# Patient Record
Sex: Female | Born: 1960 | Race: Black or African American | Hispanic: No | Marital: Single | Smoking: Never smoker
Health system: Southern US, Community
[De-identification: ages and names within clinical notes are randomized; demographics above are authoritative.]

## PROBLEM LIST (undated history)

## (undated) DIAGNOSIS — M1990 Unspecified osteoarthritis, unspecified site: Secondary | ICD-10-CM

## (undated) DIAGNOSIS — H269 Unspecified cataract: Secondary | ICD-10-CM

## (undated) DIAGNOSIS — I10 Essential (primary) hypertension: Secondary | ICD-10-CM

## (undated) HISTORY — DX: Unspecified osteoarthritis, unspecified site: M19.90

## (undated) HISTORY — DX: Essential (primary) hypertension: I10

## (undated) HISTORY — DX: Unspecified cataract: H26.9

## (undated) HISTORY — PX: ABDOMINAL HYSTERECTOMY: SHX81

---

## 1999-12-23 ENCOUNTER — Encounter: Payer: Self-pay | Admitting: Unknown Physician Specialty

## 1999-12-23 ENCOUNTER — Encounter: Admission: RE | Admit: 1999-12-23 | Discharge: 1999-12-23 | Payer: Self-pay | Admitting: Family Medicine

## 2001-06-24 ENCOUNTER — Other Ambulatory Visit: Admission: RE | Admit: 2001-06-24 | Discharge: 2001-06-24 | Payer: Self-pay | Admitting: Obstetrics and Gynecology

## 2001-11-07 ENCOUNTER — Inpatient Hospital Stay (HOSPITAL_COMMUNITY): Admission: RE | Admit: 2001-11-07 | Discharge: 2001-11-10 | Payer: Self-pay | Admitting: Obstetrics and Gynecology

## 2001-11-07 ENCOUNTER — Encounter (INDEPENDENT_AMBULATORY_CARE_PROVIDER_SITE_OTHER): Payer: Self-pay | Admitting: Specialist

## 2001-11-21 ENCOUNTER — Encounter: Admission: RE | Admit: 2001-11-21 | Discharge: 2001-11-21 | Payer: Self-pay | Admitting: Obstetrics and Gynecology

## 2001-11-21 ENCOUNTER — Encounter: Payer: Self-pay | Admitting: Obstetrics and Gynecology

## 2005-01-08 ENCOUNTER — Ambulatory Visit (HOSPITAL_COMMUNITY): Admission: RE | Admit: 2005-01-08 | Discharge: 2005-01-08 | Payer: Self-pay | Admitting: Obstetrics and Gynecology

## 2006-01-11 ENCOUNTER — Ambulatory Visit (HOSPITAL_COMMUNITY): Admission: RE | Admit: 2006-01-11 | Discharge: 2006-01-11 | Payer: Self-pay | Admitting: Internal Medicine

## 2008-04-19 ENCOUNTER — Ambulatory Visit: Payer: Self-pay | Admitting: Obstetrics and Gynecology

## 2008-04-19 ENCOUNTER — Ambulatory Visit (HOSPITAL_COMMUNITY): Admission: RE | Admit: 2008-04-19 | Discharge: 2008-04-19 | Payer: Self-pay | Admitting: Internal Medicine

## 2009-10-21 ENCOUNTER — Ambulatory Visit (HOSPITAL_COMMUNITY): Admission: RE | Admit: 2009-10-21 | Discharge: 2009-10-21 | Payer: Self-pay | Admitting: Internal Medicine

## 2009-10-28 ENCOUNTER — Encounter: Admission: RE | Admit: 2009-10-28 | Discharge: 2009-10-28 | Payer: Self-pay | Admitting: Internal Medicine

## 2010-04-07 ENCOUNTER — Encounter: Admission: RE | Admit: 2010-04-07 | Discharge: 2010-04-07 | Payer: Self-pay | Admitting: Internal Medicine

## 2010-11-04 ENCOUNTER — Encounter
Admission: RE | Admit: 2010-11-04 | Discharge: 2010-11-04 | Payer: Self-pay | Source: Home / Self Care | Attending: Internal Medicine | Admitting: Internal Medicine

## 2011-03-17 NOTE — Group Therapy Note (Signed)
NAMEJOIE, HIPPS NO.:  [id number]   MEDICAL RECORD NO.:  [id number]          PATIENT TYPE:  WOC   LOCATION:  [facility]                   FACILITY:  [facility]   PHYSICIAN:  Argentina Donovan, MD        DATE OF BIRTH:  1961/08/19   DATE OF SERVICE:                                  CLINIC NOTE   The patient is a 50 year old African American female came in for annual  pelvic examination.   HISTORY:  She is a gravida 3, para 3-0-0-3, underwent abdominal  hysterectomy 6 years ago for fibroid tumors, still has her ovaries and  tubes, is in good health except for her weight at 205 pounds and she is  5 feet 6 inches tall.  She is on Benicar and hydrochlorothiazide for  hypertension and today her blood pressure is 131/84. Pulse was 75 and  temperature was 96.9.   PHYSICAL EXAMINATION:  BREASTS:  Her breasts were extremely large  pendulous and no dominant masses.  No lymphadenopathy and no nipple  discharge.  ABDOMEN:  Soft, flat, nontender.  No masses, no organomegaly.  External genitalia is normal.  BUS within normal limits.  Vagina is  clean and well-rugated, status hysterectomy.  Bimanual pelvic  examination of the ovaries could not be palpated because of habitus of  the patient.  RECTAL:  Revealed no masses.   IMPRESSION:  Normal gynecological examination status post hysterectomy.  The patient has been informed today that she does not need further Pap  smears.  She had a mammogram today and will continue to get those  biannually until she is 49.  She has no family history of breast cancer,  and wet prep was taken because of a leukorrhea.           ______________________________  Argentina Donovan, MD     PR/MEDQ  D:  04/19/2008  T:  04/19/2008  Job:  [id number]

## 2011-03-20 IMAGING — MG MM DIGITAL SCREENING
4 series · 4 of 4 positions shown · non-contrast
Comparison: none

DG SCREEN MAMMOGRAM BILATERAL
Bilateral CC and MLO view(s) were taken.

DIGITAL SCREENING MAMMOGRAM WITH CAD:
There are scattered fibroglandular densities.  A possible mass is noted in the left breast.  Spot 
compression views and possibly sonography are recommended for further evaluation.  In the right 
breast, no masses or malignant type calcifications are identified.  Compared with prior studies.
Images were processed with CAD.

[R CC]
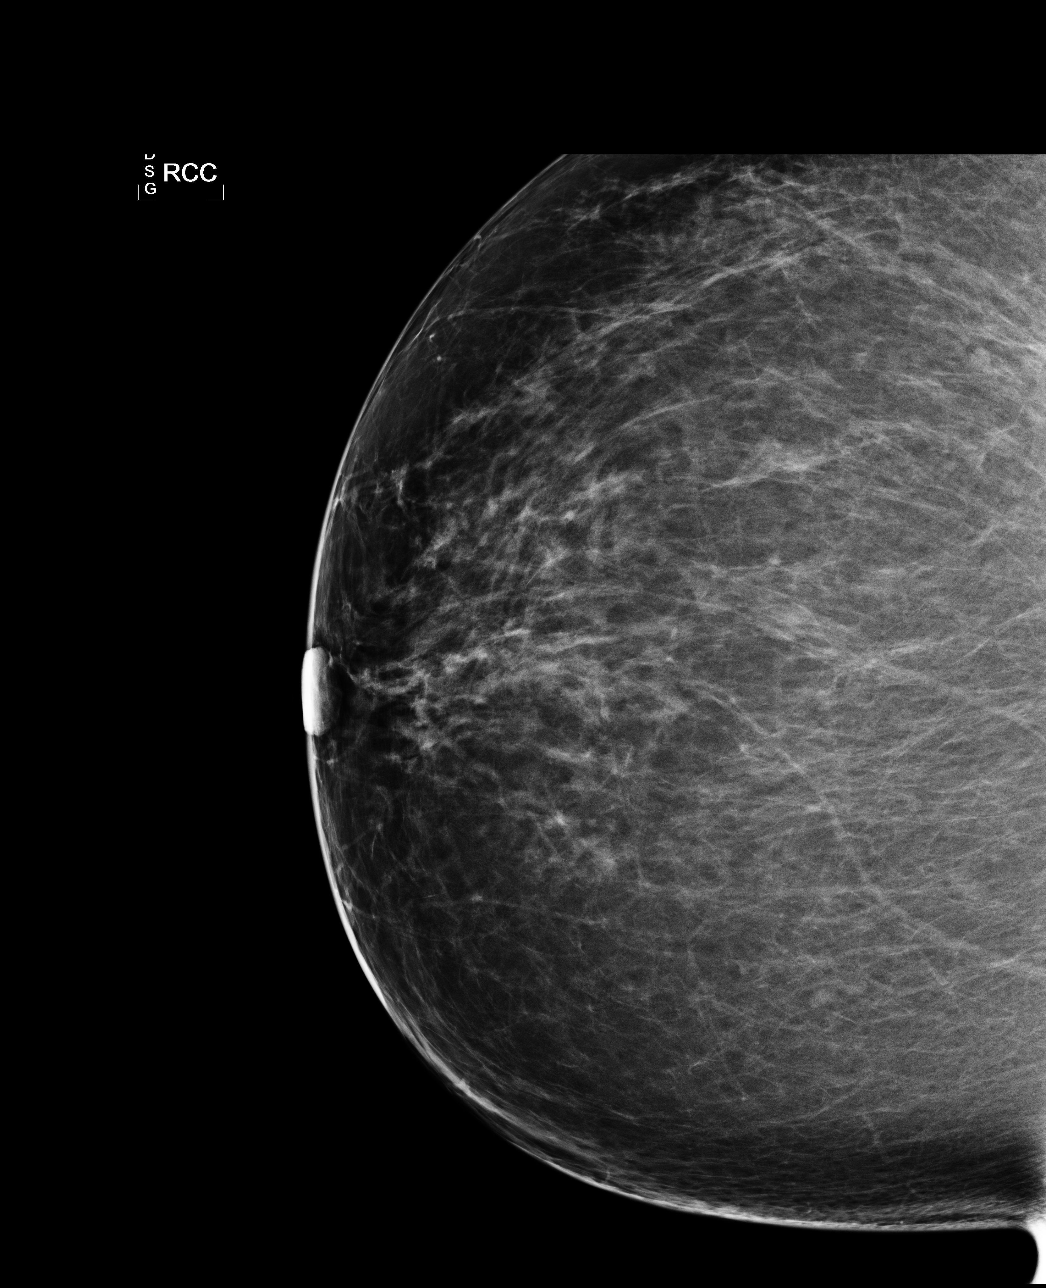

[R MLO]
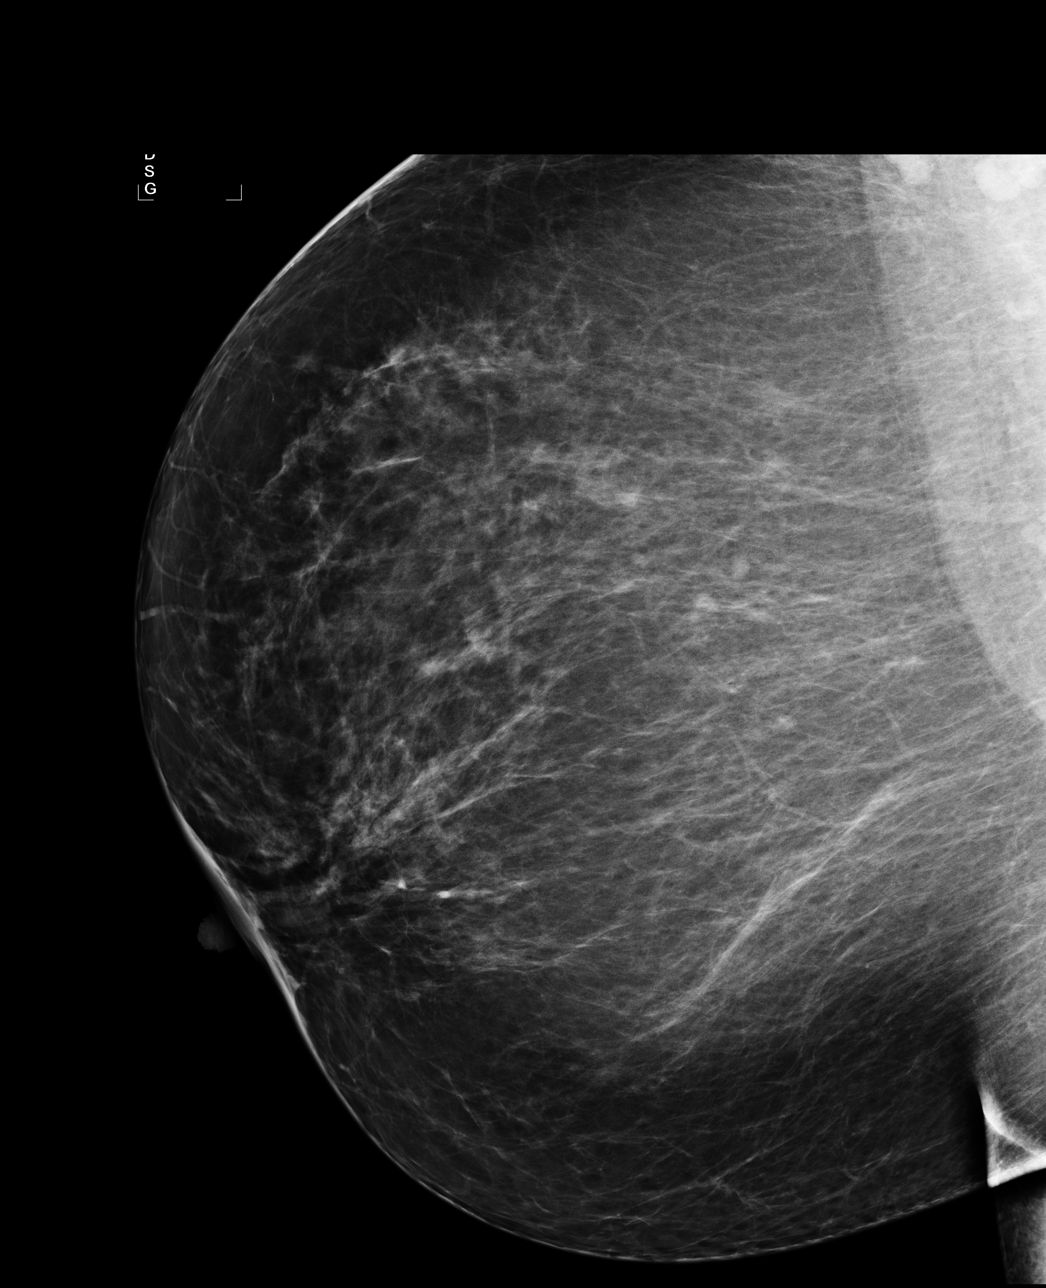

[L CC]
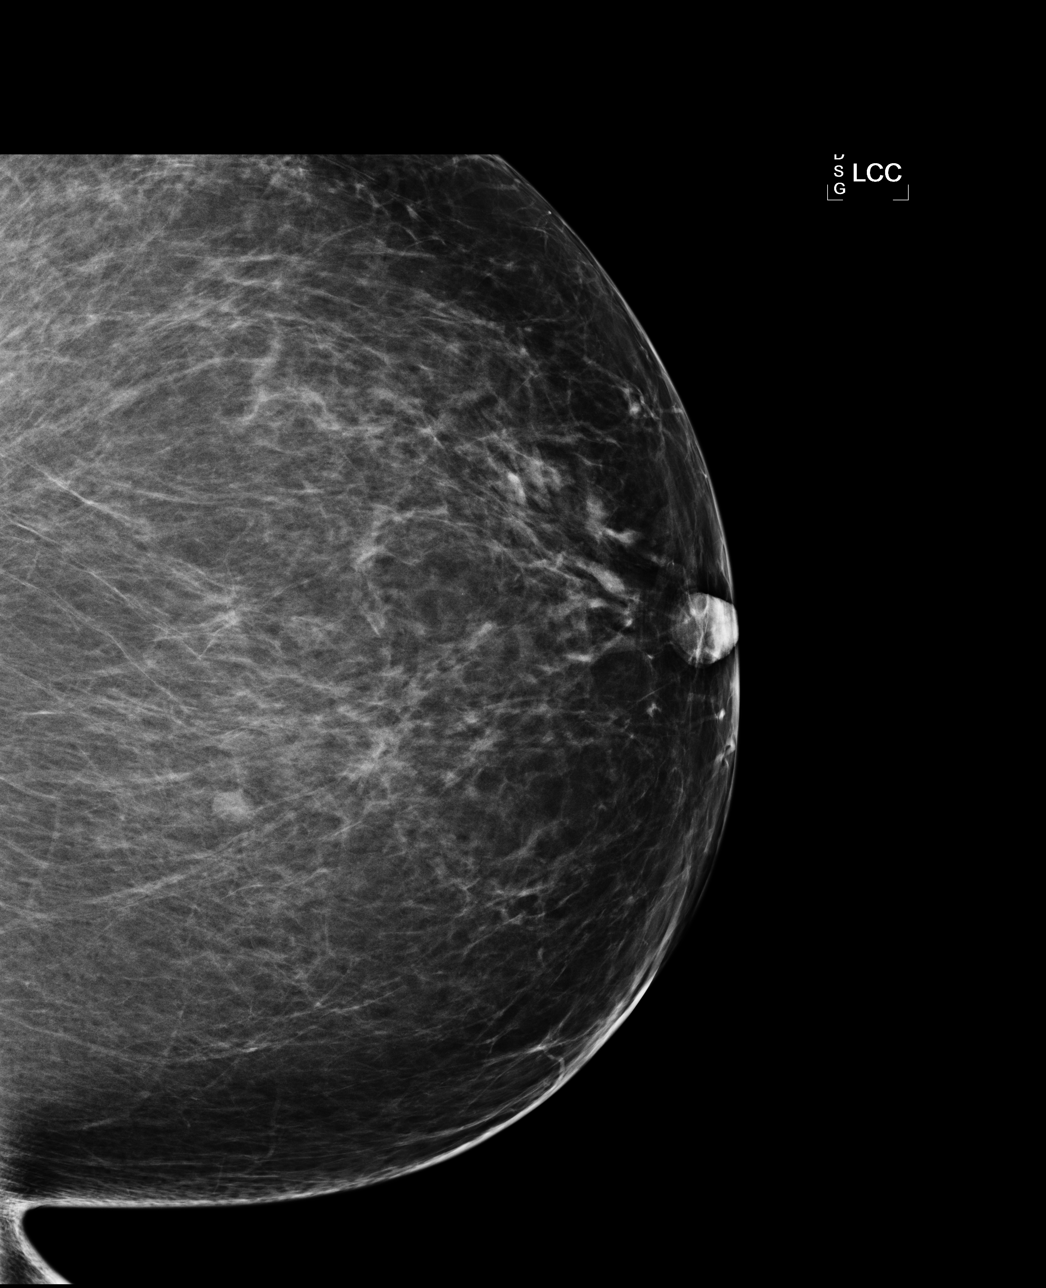

[L MLO]
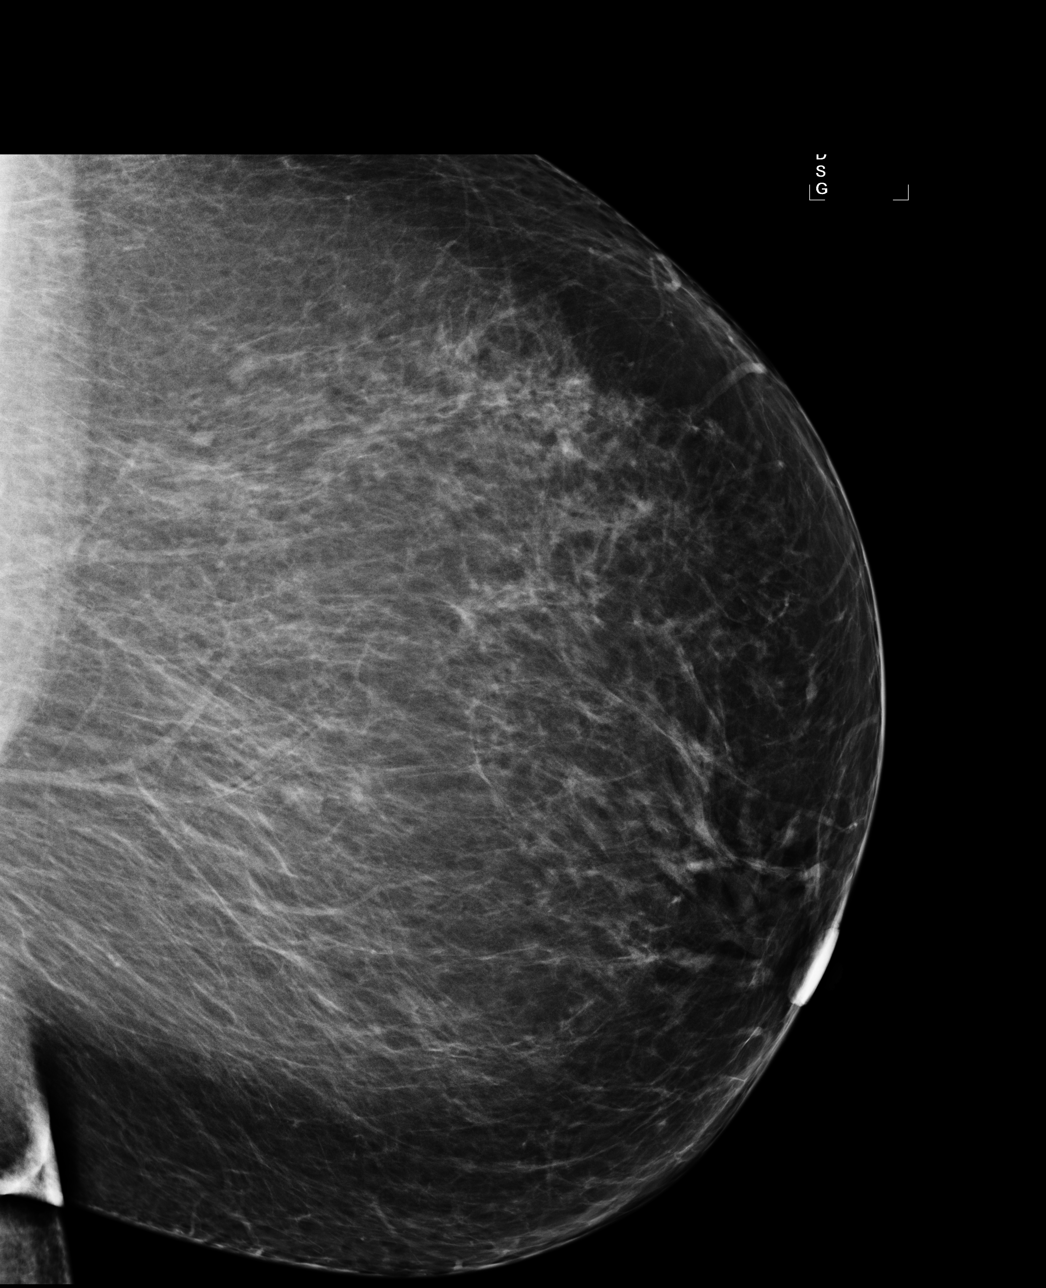

[4 of 4 positions shown; findings below may reference images not displayed]

IMPRESSION: Possible mass, left breast.  Additional evaluation is indicated.  The patient will be contacted for
additional studies and a supplementary report will follow.  No specific mammographic evidence of 
malignancy, right breast.

ASSESSMENT: Need additional imaging evaluation and/or prior mammograms for comparison - BI-RADS 0

Further imaging of the left breast.
,

## 2011-03-20 NOTE — H&P (Signed)
[facility]  Patient:    Angel Ramos, Angel Ramos Visit Number: [id number] MRN: [id number]          Service Type: Attending:  Sheronette A. Cherly Hensen, M.D. Dictated by:   Sheria Lang. Cherly Hensen, M.D. Adm. Date:  09/26/01                           History and Physical  DATE OF BIRTH:                1961/08/01  CHIEF COMPLAINT:              Heavy menses, uterine fibroids, desires removal of Norplant.  HISTORY OF PRESENT ILLNESS:   This is a 50 year old gravida 3 para 0, 0-3-0, single black female, last menstrual period September 13, 2001, now being admitted for total abdominal hysterectomy and removal of Norplant secondary to menorrhagia and uterine fibroids and no longer requiring Norplant.  The patient was previously followed by Dr. Okey Dupre in [place], [place]. She reports cycles heavy for the past five years and over the last three months she had started to soil her linens and clothing.  She wears two [employer] Always pads and changes them every two hours soaked.  The patient denies any clots or cramps.  Her cycles are 28 days apart, lasting about five days.  She has no intermenstrual or postcoital bleeding.  Her evaluation included an ultrasound done on June 24, 2001, at which time the uterus measured 18.2 x 7.7 x 9.3 cm with multiple fibroids.  The largest was 7.3 cm and located in the fundal area.  Her left ovary was not seen at the time, the right ovary appeared normal, and the endometrial stripe was not fully evaluated due to distortion by the fibroids.  Endometrial biopsy was performed on June 24, 2001 and showed secretory endometrium associated with focal granulomatous inflammation.  Fungal stains were negative subsequently.  Pap smear was normal on June 24, 2001.  The patient declines myomectomy.  She prefers definitive management of her menorrhagia.  ALLERGIES:                    No known drug allergies.  MEDICATIONS:                   1. Hydrochlorothiazide.                               2. Ativan p.r.n.  PAST MEDICAL HISTORY:         Recently diagnosed chronic hypertension.  PAST SURGICAL HISTORY:        D&C x 3.  PAST OBSTETRICAL HISTORY:     TAB x 3.  GYNECOLOGIC HISTORY:          Norplant.  SOCIAL HISTORY:               Works at [employer] at [employer].  She is single, involved.  FAMILY HISTORY:               Father died of diabetes, hypertension, and heart disease at age 50.  No breast, colon, or ovarian cancer in the family.  REVIEW OF SYSTEMS:            Occasional loss of urine with sneeze.  All other systems are negative with the exception of the abnormal vaginal bleeding documented in the History of Present  Illness.  PHYSICAL EXAMINATION:  GENERAL:                      Well-developed, well-nourished black female, anxious.  VITAL SIGNS:                  Blood pressure 152/100, pulse 82.  Weight 197 pounds.  SKIN:                         Notable for hypopigmented areas on vulva and breast.  HEENT:                        Sclerae anicteric.  Pale, pink conjunctivae. Oropharynx negative.  HEART:                        Regular rate and rhythm without murmur.  LUNGS:                        Clear to auscultation.  NODES:                        No palpable supraclavicular or axillary nodes noted.  No inguinal nodes noted.  NECK:                         Supple.  Thyroid not palpable.  ABDOMEN:                      Soft, with palpable mass arising to two fingerbreadths above the umbilicus, nontender.  Spleen or liver not palpable.  BREAST:                       Soft, nontender.  No palpable mass.  Upward nipples without discharge.  BACK:                         No CVA tenderness.  PELVIC:                       External genitalia, no palpable mass.  Some hyperpigmented areas as well.  Vagina, no discharge.  Cervix was closed and high in the vault.  Uterus about 20-22 week size,  irregular.  Adnexa unable to assess secondary to increased uterine size.  RECTAL:                       Deferred.  IMPRESSION:                   1. Symptomatic uterine fibroids.                               2. Chronic hypertension.                               3. Vitiligo.                               4. Norplant.  PLAN:                         1. Admission.  2. Exploratory laparotomy.                               3. Total abdominal hysterectomy.                               4. Removal of Norplant.                               5. Antibiotics prophylaxis.                               6. Antiembolic stockings.                               7. Resumption of antihypertensive medicines                                  postoperatively.                               8. Risks of surgery were reviewed with the                                  patient including, but not limited to,                                  infection, bleeding, internal scar tissue,                                  injury to surrounding organ structures such                                  as bowel, bladder, ureter; fistula formation.                                  Pros and cons of ovarian preservation were                                  discussed such as ovarian cyst formation,                                  ovarian cancer, and possible need for surgery                                  in the future as a result of these things.                                  Risks of blood transfusion including acute  reaction, hepatitis transmission 1:3000,                                  HIV 1:100,000 were discussed.  Loss of                                  fertility, childbearing also discussed.                                  Postoperative care and criteria for discharge                                  were reviewed.  All questions were  answered. Dictated by:   Sheria Lang. Cherly Hensen, M.D.  Attending:  Sheronette A. Cherly Hensen, M.D. DD:  09/26/01 TD:  09/26/01 Job: [id number]

## 2011-03-20 NOTE — Op Note (Signed)
[facility]  Patient:    Angel Ramos, PEAKE Visit Number: [id number] MRN: [id number]          Service Type: GYN Location: [phone] [id number] Attending Physician:  Maxie Better Dictated by:   Sheria Lang. Cherly Hensen, M.D. Proc. Date: 11/07/01 Admit Date:  11/07/2001                             Operative Report  PREOPERATIVE DIAGNOSES:       1. Menorrhagia.                               2. Uterine fibroids.                               3. Norplant contraception.  POSTOPERATIVE DIAGNOSES:      1. Menorrhagia.                               2. Uterine fibroids.                               3. Norplant contraception.  PROCEDURES:                   1. Examination under anesthesia.                               2. Exploratory laparotomy.                               3. Total abdominal hysterectomy.                               4. Removal of Norplant.  SURGEON:                      Sheronette A. Cherly Hensen, M.D.  ASSISTANT:                    Lenoard Aden, M.D.  ANESTHESIA:                   General.  INDICATIONS:                  The patient is a 50 year old gravida 3, para 0-0-3-0 female.  Her last menstrual period was November 05, 2001 to present. She has known uterine fibroids.  She is now being admitted for total abdominal hysterectomy secondary to menorrhagia.  The patient also has a Norplant in place, which she desires to have removed at this time.  Please see the dictated admission history and physical for all the details.  The risks and benefits of the procedure have been explained to the patient.  Consent was signed.  The patient was transferred to the operating room.  DESCRIPTION OF PROCEDURE:     Under adequate general anesthesia, the patient was placed in the supine position.  Examination under anesthesia revealed a uterus that was three fingerbreadths above the umbilicus.  A copious amount of malodorous menstrual blood was noted in  the vagina.  The  patient was sterilely prepped and draped in the usual fashion for hysterectomy.  An indwelling Foley catheter was placed.  The patient had received prophylactic antibiotics prior to transfer to the operating room.  Pneumoboots were utilized.  A marking pen was utilized to outline the incision on the skin.  A total of 8 cc of 0.5% Marcaine was injected along the imaginary incision line.  A Pfannenstiel skin incision was then made and carried down to the rectus fascia using sharp dissection.  The rectus fascia was then dissected in the midline and extended bilaterally.  The rectus fascia was then bluntly and with cautery dissected off the rectus muscle in superior and inferior fashion.  The rectus muscle was split in the midline.  The parietal peritoneum was entered bluntly and extended superiorly, inferiorly and transversely.  Exploration of the abdomen was notable for a markedly distorted uterus, about 18 weeks in size, with multiple fibroids.  The upper abdomen was palpable with a normal liver edge and normal kidneys.  The left round ligament was grasped with a Babcock, suture ligated with 0 Vicryl suture and severed using Bovie cautery.  The same procedure was performed on the contralateral right round ligament.  An attempt was then made to exteriorize the uterus.  However, it was noted to be limited due to the incision, at which time the skin incision was further extended. Reattempted exteriorization again was unsuccessful.  The fascia had been extended and subsequently opened in the midline for about 2 cm on the inferior aspect in order to facilitate more exposure.  The uterus was finally exteriorized and the tubes and ovaries were inspected.  The left tube and ovary appeared to be normal.  The right ovary had about a 3 cm cyst.  The right tube was otherwise normal.  The vesicouterine peritoneum was opened. The bladder was then sharply dissected off the lower uterine  segment and displaced inferiorly.  The posterior lip of the broad ligament on the left was then opened.  The utero-ovarian ligament was then doubly clamped, but, free tied with 0 Vicryl and suture ligated with 0 Vicryl.  Due to the exposure, the decision was made to proceed with isolating the uterine vessels on the left, doubly clamping them, cut, suture ligated with 0 Vicryl x 2.  On the right, the broad ligament was then opened and the right utero-ovarian ligaments were then doubly clamped, cut, free tied with 0 Vicryl and suture ligated with 0 Vicryl.  The uterine vessel was skeletonized, doubly clamped, cut and suture ligated with 0 Vicryl x 2.  Increased bleeding was noted at that time.  The vessel was isolated lateral to the pedicle and was suture ligated with 0 Vicryl suture.  A small bite of the cardinal ligament was then obtained on the right using straight Heaneys, cut and suture ligated with 0 Vicryl suture. The fundus of the uterus was truncated from its cervical attachment in order to facilitate the surgery.  The stump of the cervix was then grasped and the hysterectomy continued.  The bladder was further sharply dissected and displaced inferiorly.  The uterosacral ligaments were taut and distorted the anatomy posteriorly.  Nonetheless, the right uterosacral ligament was then clamped in two pieces and suture ligated with 0 Vicryl x 2.  This allowed for the cervix to be further mobilized.  The cardinal ligaments were then serially clamped, cut and suture ligated with 0 Vicryl until the cervicovaginal junction was reached on the right.  On the left,  again, the uterosacral ligament had been clamped.  However, due to its thickness, it was unable to be clamped in one sitting.  It was also subsequently cut and suture ligated with 0 Vicryl suture.  The cervix was then inspected.  The cardinal ligament on the left was then serially clamped, cut and suture ligated until  the cervicovaginal junction was reached.  The vagina was then opened posteriorly and, using curved scissors, the cervix was separated from its vaginal  attachment circumferentially.  The angles were then secured using a modified Richardson suture.  The vagina was reefed using 0 Vicryl running lock stitch. This was then closed vertically with interrupted 0 Vicryl figure-of-eight sutures.  Inspection of the pedicles was notable for some bleeding on the right, which was clamped and suture ligated with 3-0 Vicryl sutures.  The right ovarian cyst, which had not been noted on prior ultrasound, was cauterized and the fluid removed to prevent torsion of that ovary in the healing process.  The ovary otherwise appeared normal.  The ovarian pedicle was regrasped and free tied and suture ligated with 0 Vicryl and attached to the round ligament bilaterally.  The abdomen was then irrigated, suctioned of debris, reinspected and a small bleeder was noted on the left near the fallopian tube.  This was grasped with a Tresa Endo and a figure-of-eight suture was then used to secure hemostasis.  Reirrigation and suction showed subsequently good hemostasis, at which time the packings were removed, as was the Balfour retractor which had been placed when the body of the uterus had been severed from its cervical attachment.  Attempt at locating the appendix was unsuccessful and it was suggested that it was retrocecal.  The parietal peritoneum was then closed.  The undersurface of the rectus fascia was inspected.  Small bleeders were cauterized.  The vertical defect in the inferior aspect of the rectus fascia was closed with interrupted figure-of-eight sutures and then the rectus fascia was closed with 0 Vicryl x 2.  The subcutaneous area was irrigated.  Small bleeders were cauterized and the skin was approximated using Ethicon staples.  Attention was then turned to the left inner arm where the Norplant was placed.  The  area was prepped with Betadine.  Marcaine 0.5% was injected along the previous scar. Using a scalpel, and incision of about 1 cm was made and the Norplant insert x 5 was removed.  The skin was approximated using 4-0 Vicryl suture and Steri-Strips were subsequently placed.  SPECIMENS:                    1. Norplant inserts x 5.                               2. Uterus with cervix.  ESTIMATED BLOOD LOSS:         900 cc.  INTRAOPERATIVE FLUID:         1900 cc crystalloid.  URINE OUTPUT:                 225 cc clear yellow urine.  Of note, the ureters were inspected and palpated and noted to be normal in caliber bilaterally at the end of the case.  SPONGE AND INSTRUMENT COUNTS: Correct x 2.  COMPLICATIONS:                None.  DISPOSITION:  The patient tolerated the procedure well and was transferred to the recovery room in stable condition. Dictated by:   Sheria Lang. Cherly Hensen, M.D. Attending Physician:  Maxie Better DD:  11/07/01 TD:  11/08/01 Job: [id number]

## 2011-03-20 NOTE — Discharge Summary (Signed)
[facility]  Patient:    Angel Ramos, Angel Ramos Visit Number: [id number] MRN: [id number]          Service Type: GYN Location: [phone] [id number] Attending Physician:  Maxie Better Dictated by:   Sheria Lang. Cherly Hensen, M.D. Admit Date:  11/07/2001 Discharge Date: 11/10/2001                             Discharge Summary  ADMISSION DIAGNOSES:          1. Symptomatic uterine fibroids.                               2. Chronic hypertension.                               3. Vitiligo.                               4. Norplant contraception.  DISCHARGE DIAGNOSES:          1. Uterine fibroids.                               2. Chronic hypertension.                               3. Vitiligo.                               4. Norplant removal.  PROCEDURE:                    Examination under anesthesia, exploratory laparotomy, total abdominal hysterectomy, Norplant removal.  HOSPITAL COURSE:              This is a 50 year old gravida 3, para 0-0-3-0 female with symptomatic uterine fibroids, who was admitted for total abdominal hysterectomy.  The patient has been having heavy menses.  Ultrasound done on August 2002, showed an 18.2 cm multiple fibroid uterus.  The patient was admitted to [facility].  She was taken to the operating room where she underwent a total abdominal hysterectomy and Norplant removal.  Findings at the time of the surgery were consistent with an 18-week size uterus with multiple fibroids, the right ovary with a 3 cm cyst, which had clear fluid, the left ovary was normal, the tubes were normal bilaterally, and five Norplant inserts removed.  The ureters were noted to be normal in caliber bilaterally, normal liver edge, and kidney was palpably normal.  The patient had an uncomplicated postoperative course.  The CBC on postoperative day #1, showed a hemoglobin of 9.5, hematocrit of 28.4, white count of 12.4.  The temperature maximum was  100.2 on postoperative day #2, and she subsequently had a temperature maximum of 100.6, but then defervesced by postoperative day #3.  Her blood pressures were around 139/77. By postoperative day #3, the patient had had a bowel movement.  She was tolerating a regular diet.  She was complaining of some dysuria.  A urinalysis was obtained and showed ketones, negative for nitrites.  Her incision showed no erythema, induration, or exudate.  The staples were removed.  Steri-Strips were placed on postoperative day #3.  She was deemed well to be discharged home.  DISPOSITION:  Home.  CONDITION ON DISCHARGE:  Stable.  DISCHARGE MEDICATIONS: 1. Dilaudid 25 one to two tablets q.3-4h. p.r.n. pain. 2. Motrin 800 mg 1 p.o. q.6h. p.r.n. pain. 3. Hydrochlorothiazide 25 mg p.o. q.d. 4. Atenolol 50 mg p.o. q.d.  DISCHARGE INSTRUCTIONS:  Call for temperature greater than or equal to 100.4, nothing per vagina for 4-6 weeks, no heavy lifting or driving for two weeks. Call if increase in incisional pain, drainage from incision site, or redness of incision, severe abdominal pain, nausea, or vomiting.  FOLLOWUP:  Followup appointment at four weeks at [facility]. Dictated by:   Sheria Lang. Cherly Hensen, M.D. Attending Physician:  Maxie Better DD:  12/08/01 TD:  12/10/01 Job: [id number]

## 2011-03-20 NOTE — H&P (Signed)
[facility]  Patient:    Angel Ramos, Angel Ramos Visit Number: [id number] MRN: [id number]          Service Type: Attending:  Sheronette A. Cherly Hensen, M.D. Dictated by:   Sheria Lang. Cherly Hensen, M.D. Adm. Date:  11/07/01                           History and Physical  CHIEF COMPLAINT:              Heavy menses, uterine fibroids, and desires removal of Norplant.  HISTORY OF PRESENT ILLNESS:   This is a 50 year old, gravida 3, para 0-0-3-0, single black female with symptomatic uterine fibroids who is now being admitted for a total abdominal hysterectomy. The patient has also had Norplant in place for greater than five years and desires its removal. The patient has had heavy menses over the past five years and it has increased to the point of soiling her bed linens and clothing. She wears two Super Plus Always pads and changes them every two hours and they are both soaked. The patient denies any clots or cramps. Her cycles are about 28 days apart, lasting about five days. She has no intramenstrual or postcoital bleeding. Ultrasound done on June 24, 2001 showed a uterus that measured 18.2 x 7.7 x 9.3 cm with multiple fibroids; the largest was 7.3 cm and located in the fundal area. Her left ovary was not seen at the time. The right ovary appeared normal. Endometrial stripe was distorted and therefore could not be fully evaluated due to the fibroid. Endometrial biopsy performed on August 21 showed secretory endometrium with focal granulomatous inflammation. Fungal stains on the endometrial biopsy specimen were negative. The Pap was normal in August of 2002. The patient declined alternative treatments including myomectomy, uterine artery embolization.  ALLERGIES:  No known drug allergies.  MEDICINES:                    1. Hydrochlorothiazide p.o. q.d.                               2. Atenolol 50 mg p.o. q.d.  PAST MEDICAL HISTORY:         Chronic  hypertension.  PAST SURGICAL HISTORY:        D&C x 3.  PAST OBSTETRICAL HISTORY:     Elective termination x 3.  GYNECOLOGICAL HISTORY:        Norplant insertion.  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 She is single and in a monogamous relationship.  FAMILY HISTORY:               Father died of diabetes, hypertension, and heart disease at age 23. Family history is negative for any genital, colon, or breast cancer.  REVIEW OF SYSTEMS:            Loss of urine on occasion with sneeze. All other systems are negative except those with the gynecological care.  PHYSICAL EXAMINATION:  GENERAL:                      Anxious, well-developed, well-nourished black female.  SKIN:  Hypopigmented areas on the vulva and breast.  HEENT:                        Anicteric sclerae. Pale, pink conjunctivae. Oropharynx negative.  HEART:                        Regular rate and rhythm without murmur.  LUNGS:                        Clear to auscultation.  NODES:                        No supraclavicular, axillary, or inguinal nodes noted.  NECK:                         Supple. Thyroid not palpable. Full range of motion noted.  ABDOMEN:                      Soft palpable mass arising two fingerbreadths above the umbilicus, nontender. No spleen or liver was palpable.  BREASTS:                      Soft, nontender. No palpable mass. Upward nipples without discharge.  BACK:                         No CVA tenderness.  PELVIC:                       External genitalia: Positive hypopigmented areas on the labia majus. Some hyperpigmented areas in the medial aspect of the labia minora. Vagina had no discharge. The cervix was closed and very high in the vault. Uterus about 20 to 22 weeks size, irregular. Adnexa: Unable to evaluate secondary to increased uterine size.  RECTAL EXAM:                  Not  indicated.  IMPRESSION:                   1. Symptomatic uterine fibroids.                               2. Chronic hypertension.                               3. Vitiligo.                               4. Norplant contraception.  PLAN:                         1. Admission.                               2. Exploratory laparotomy with total abdominal                                  hysterectomy and preservation of ovarian  function.                               3. Removal of the Norplant.                               4. Antibiotic prophylaxis.                               5. Antiembolic stockings.                               6. Resumption of antihypertensive medicines                                  postoperatively.  Risks of the surgery have been reviewed with the patient including, but not limited to, infection, bleeding, internal scar tissue which may result in bowel obstruction or a need for additional surgery secondary to pelvic pain, injury to surrounding organ structures such as the bladder, bowel, ureter, and Fistula formation from vagina to rectum, ureter to vagina. With the preservation of her ovaries, the risks include ovarian cyst formation and ovarian cancer with possible need for surgery as a result of these findings and/or pain in the future. The risk of blood transfusion, including acute reaction, hepatitis transmission 1 out of 3000, HIV transmission 1 out of 100,000 were discussed. Loss of fertility and childbearing as a result of this removal of the uterus was also discussed. Criteria for discharge and postoperative expectations were discussed. All questions answered. Dictated by:   Sheria Lang. Cherly Hensen, M.D. Attending:  Sheronette A. Cherly Hensen, M.D. DD:  11/06/01 TD:  11/06/01 Job: [id number]

## 2012-07-18 ENCOUNTER — Other Ambulatory Visit (HOSPITAL_COMMUNITY): Payer: Self-pay | Admitting: Internal Medicine

## 2012-07-18 DIAGNOSIS — Z1231 Encounter for screening mammogram for malignant neoplasm of breast: Secondary | ICD-10-CM

## 2012-07-27 ENCOUNTER — Ambulatory Visit (HOSPITAL_COMMUNITY)
Admission: RE | Admit: 2012-07-27 | Discharge: 2012-07-27 | Disposition: A | Discharge status: Home / Self Care | Payer: Self-pay | Source: Ambulatory Visit | Attending: Internal Medicine | Admitting: Internal Medicine

## 2012-07-27 DIAGNOSIS — Z1231 Encounter for screening mammogram for malignant neoplasm of breast: Secondary | ICD-10-CM

## 2013-06-30 ENCOUNTER — Other Ambulatory Visit: Payer: Self-pay | Admitting: Internal Medicine

## 2013-06-30 ENCOUNTER — Other Ambulatory Visit: Payer: Self-pay | Admitting: Nurse Practitioner

## 2013-06-30 DIAGNOSIS — Z1231 Encounter for screening mammogram for malignant neoplasm of breast: Secondary | ICD-10-CM

## 2013-07-28 ENCOUNTER — Ambulatory Visit
Admission: RE | Admit: 2013-07-28 | Discharge: 2013-07-28 | Disposition: A | Discharge status: Home / Self Care | Payer: BC Managed Care – PPO | Source: Ambulatory Visit | Attending: Nurse Practitioner | Admitting: Nurse Practitioner

## 2013-07-28 DIAGNOSIS — Z1231 Encounter for screening mammogram for malignant neoplasm of breast: Secondary | ICD-10-CM

## 2014-07-03 ENCOUNTER — Other Ambulatory Visit: Payer: Self-pay

## 2014-07-04 ENCOUNTER — Other Ambulatory Visit: Payer: Self-pay

## 2014-07-04 DIAGNOSIS — Z1231 Encounter for screening mammogram for malignant neoplasm of breast: Secondary | ICD-10-CM

## 2014-08-13 ENCOUNTER — Ambulatory Visit
Admission: RE | Admit: 2014-08-13 | Discharge: 2014-08-13 | Disposition: A | Discharge status: Home / Self Care | Payer: No Typology Code available for payment source | Source: Ambulatory Visit

## 2014-08-13 DIAGNOSIS — Z1231 Encounter for screening mammogram for malignant neoplasm of breast: Secondary | ICD-10-CM

## 2014-11-20 ENCOUNTER — Ambulatory Visit: Payer: No Typology Code available for payment source | Admitting: Family Medicine

## 2014-12-05 ENCOUNTER — Ambulatory Visit: Payer: 59 | Attending: Internal Medicine | Admitting: Internal Medicine

## 2014-12-05 ENCOUNTER — Encounter: Payer: Self-pay | Admitting: Internal Medicine

## 2014-12-05 VITALS — BP 127/88 | HR 68 | Temp 98.0°F | Resp 16 | Wt 220.4 lb

## 2014-12-05 DIAGNOSIS — M25562 Pain in left knee: Secondary | ICD-10-CM

## 2014-12-05 DIAGNOSIS — Z1389 Encounter for screening for other disorder: Secondary | ICD-10-CM | POA: Diagnosis not present

## 2014-12-05 DIAGNOSIS — I10 Essential (primary) hypertension: Secondary | ICD-10-CM | POA: Diagnosis not present

## 2014-12-05 DIAGNOSIS — R21 Rash and other nonspecific skin eruption: Secondary | ICD-10-CM | POA: Insufficient documentation

## 2014-12-05 DIAGNOSIS — Z833 Family history of diabetes mellitus: Secondary | ICD-10-CM | POA: Insufficient documentation

## 2014-12-05 DIAGNOSIS — M13861 Other specified arthritis, right knee: Secondary | ICD-10-CM | POA: Insufficient documentation

## 2014-12-05 DIAGNOSIS — Z1211 Encounter for screening for malignant neoplasm of colon: Secondary | ICD-10-CM

## 2014-12-05 DIAGNOSIS — M25561 Pain in right knee: Secondary | ICD-10-CM

## 2014-12-05 DIAGNOSIS — M13862 Other specified arthritis, left knee: Secondary | ICD-10-CM | POA: Insufficient documentation

## 2014-12-05 DIAGNOSIS — Z139 Encounter for screening, unspecified: Secondary | ICD-10-CM

## 2014-12-05 LAB — COMPLETE METABOLIC PANEL WITH GFR
ALBUMIN: 3.9 g/dL (ref 3.5–5.2)
ALK PHOS: 70 U/L (ref 39–117)
ALT: 16 U/L (ref 0–35)
AST: 23 U/L (ref 0–37)
BUN: 13 mg/dL (ref 6–23)
CO2: 28 meq/L (ref 19–32)
Calcium: 9.7 mg/dL (ref 8.4–10.5)
Chloride: 105 mEq/L (ref 96–112)
Creat: 0.9 mg/dL (ref 0.50–1.10)
GFR, EST NON AFRICAN AMERICAN: 73 mL/min
GFR, Est African American: 84 mL/min
Glucose, Bld: 104 mg/dL — ABNORMAL HIGH (ref 70–99)
Potassium: 4.2 mEq/L (ref 3.5–5.3)
SODIUM: 141 meq/L (ref 135–145)
TOTAL PROTEIN: 6.8 g/dL (ref 6.0–8.3)
Total Bilirubin: 0.3 mg/dL (ref 0.2–1.2)

## 2014-12-05 LAB — CBC WITH DIFFERENTIAL/PLATELET
BASOS ABS: 0 10*3/uL (ref 0.0–0.1)
Basophils Relative: 0 % (ref 0–1)
EOS ABS: 0.2 10*3/uL (ref 0.0–0.7)
EOS PCT: 2 % (ref 0–5)
HCT: 39.6 % (ref 36.0–46.0)
HEMOGLOBIN: 13.1 g/dL (ref 12.0–15.0)
LYMPHS ABS: 4 10*3/uL (ref 0.7–4.0)
Lymphocytes Relative: 46 % (ref 12–46)
MCH: 28.7 pg (ref 26.0–34.0)
MCHC: 33.1 g/dL (ref 30.0–36.0)
MCV: 86.7 fL (ref 78.0–100.0)
MONO ABS: 0.4 10*3/uL (ref 0.1–1.0)
MONOS PCT: 5 % (ref 3–12)
MPV: 12 fL (ref 8.6–12.4)
NEUTROS ABS: 4.1 10*3/uL (ref 1.7–7.7)
NEUTROS PCT: 47 % (ref 43–77)
PLATELETS: 307 10*3/uL (ref 150–400)
RBC: 4.57 MIL/uL (ref 3.87–5.11)
RDW: 14.2 % (ref 11.5–15.5)
WBC: 8.7 10*3/uL (ref 4.0–10.5)

## 2014-12-05 MED ORDER — CLOTRIMAZOLE-BETAMETHASONE 1-0.05 % EX CREA: 1.0000 "application " | TOPICAL_CREAM | Freq: Two times a day (BID) | CUTANEOUS | Status: DC

## 2014-12-05 NOTE — Progress Notes (Signed)
Patient here to establish care Takes medication for hypertension Complains of some type of rash to the bottom of her right Foot that has been there about a month Patient has been putting vaseline on daily Patient [facility] her flu shot [facility] this past no[place]mber

## 2014-12-05 NOTE — Progress Notes (Signed)
Patient Demographics  Angel Dutyortia Eisemann, is a 54 y.o. female  AVW:[id number]:[id number]  WGN:[id number]  DOB - 01/03/1961  CC:  Chief Complaint  Patient presents with  . Establish Care       HPI: Angel Ramos is a 54 y.o. female here today to establish medical care.patient has history of hypertension, arthritis in knees, as per patient she is taking Tenormin and hydrochlorothiazide and is compliant in taking her medications, her blood pressure is well controlled, denies any acute symptoms denies any headache dizziness chest and shortness of breath, as per patient she has noticed rash on her right foot sole for the last 2-3 weeks, she denies any itching, has been trying over-the-counter Vaseline without much improvement. Patient has No headache, No chest pain, No abdominal pain - No Nausea, No new weakness tingling or numbness, No Cough - SOB.  No Known Allergies Past Medical History  Diagnosis Date  . Hypertension    No current outpatient prescriptions on file prior to visit.   No current facility-administered medications on file prior to visit.   Family History  Problem Relation Age of Onset  . Hypertension Mother   . Diabetes Father   . Heart disease Father   . Cancer Maternal Grandmother     cervical cancer    History   Social History  . Marital Status: Single    Spouse Name: N/A    Number of Children: N/A  . Years of Education: N/A   Occupational History  . Not on file.   Social History Main Topics  . Smoking status: Never Smoker   . Smokeless tobacco: Not on file  . Alcohol Use: 0.0 oz/week    0 Not specified per week     Comment: occasionally  . Drug Use: Not on file  . Sexual Activity: Not on file   Other Topics Concern  . Not on file   Social History Narrative  . No narrative on file    Review of Systems: Constitutional: Negative for fever, chills, diaphoresis, activity change, appetite change and fatigue. HENT: Negative for ear pain, nosebleeds,  congestion, facial swelling, rhinorrhea, neck pain, neck stiffness and ear discharge.  Eyes: Negative for pain, discharge, redness, itching and visual disturbance. Respiratory: Negative for cough, choking, chest tightness, shortness of breath, wheezing and stridor.  Cardiovascular: Negative for chest pain, palpitations and leg swelling. Gastrointestinal: Negative for abdominal distention. Genitourinary: Negative for dysuria, urgency, frequency, hematuria, flank pain, decreased urine volume, difficulty urinating and dyspareunia.  Musculoskeletal: Negative for back pain, joint swelling, arthralgia and gait problem. Neurological: Negative for dizziness, tremors, seizures, syncope, facial asymmetry, speech difficulty, weakness, light-headedness, numbness and headaches.  Hematological: Negative for adenopathy. Does not bruise/bleed easily. Psychiatric/Behavioral: Negative for hallucinations, behavioral problems, confusion, dysphoric mood, decreased concentration and agitation.    Objective:   Filed Vitals:   12/05/14 1528  BP: 127/88  Pulse: 68  Temp: 98 F (36.7 C)  Resp: 16    Physical Exam: Constitutional: Patient appears well-developed and well-nourished. No distress. HENT: Normocephalic, atraumatic, External right and left ear normal. Oropharynx is clear and moist.  Eyes: Conjunctivae and EOM are normal. PERRLA, no scleral icterus. Neck: Normal ROM. Neck supple. No JVD. No tracheal deviation. No thyromegaly. CVS: RRR, S1/S2 +, no murmurs, no gallops, no carotid bruit.  Pulmonary: Effort and breath sounds normal, no stridor, rhonchi, wheezes, rales.  Abdominal: Soft. BS +, no distension, tenderness, rebound or guarding.  Musculoskeletal: Normal range of motion. No edema and no tenderness.  Neuro: Alert. Normal reflexes, muscle tone coordination. No cranial nerve deficit. Skin: Skin is warm and dry. Right foot sole dry scaly rash on the sole  Psychiatric: Normal mood and affect.  Behavior, judgment, thought content normal.  No results found for: WBC, HGB, HCT, MCV, PLT No results found for: CREATININE, BUN, NA, K, CL, CO2  No results found for: HGBA1C Lipid Panel  No results found for: CHOL, TRIG, HDL, CHOLHDL, VLDL, LDLCALC     Assessment and plan:   1. Essential hypertension Continue with current medications, also advise for DASH diet. - CBC with Differential/Platelet - COMPLETE METABOLIC PANEL WITH GFR  2. Special screening for malignant neoplasms, colon  - Ambulatory referral to Gastroenterology  3. Screening  - Vit D  25 hydroxy (rtn osteoporosis monitoring) - TSH  4. Family history of diabetes mellitus (DM)  - Hemoglobin A1c  5. Bilateral knee pain Take OTC tylenol/ ibuprofen prn   6. Rash and nonspecific skin eruption Trial of - clotrimazole-betamethasone (LOTRISONE) cream; Apply 1 application topically 2 (two) times daily.  Dispense: 30 g; Refill: 1   Health Maintenance -Colonoscopy: referred to GI -Pap Smear: will be scheduled  -Mammogram: uptodate  -Vaccinations:  uptodate with flu shot   Return in about 3 months (around 03/05/2015) for hypertension, Schedule Appt with Dr Glendora Score for PAP.   Doris Cheadle, MD

## 2014-12-05 NOTE — Patient Instructions (Signed)
DASH Eating Plan °DASH stands for "Dietary Approaches to Stop Hypertension." The DASH eating plan is a healthy eating plan that has been shown to reduce high blood pressure (hypertension). Additional health benefits may include reducing the risk of type 2 diabetes mellitus, heart disease, and stroke. The DASH eating plan may also help with weight loss. °WHAT DO I NEED TO KNOW ABOUT THE DASH EATING PLAN? °For the DASH eating plan, you will follow these general guidelines: °· Choose foods with a percent daily value for sodium of less than 5% (as listed on the food label). °· Use salt-free seasonings or herbs instead of table salt or sea salt. °· Check with your health care provider or pharmacist before using salt substitutes. °· Eat lower-sodium products, often labeled as "lower sodium" or "no salt added." °· Eat fresh foods. °· Eat more vegetables, fruits, and low-fat dairy products. °· Choose whole grains. Look for the word "whole" as the first word in the ingredient list. °· Choose fish and skinless chicken or turkey more often than red meat. Limit fish, poultry, and meat to 6 oz (170 g) each day. °· Limit sweets, desserts, sugars, and sugary drinks. °· Choose heart-healthy fats. °· Limit cheese to 1 oz (28 g) per day. °· Eat more home-cooked food and less restaurant, buffet, and fast food. °· Limit fried foods. °· Cook foods using methods other than frying. °· Limit canned vegetables. If you do use them, rinse them well to decrease the sodium. °· When eating at a restaurant, ask that your food be prepared with less salt, or no salt if possible. °WHAT FOODS CAN I EAT? °Seek help from a dietitian for individual calorie needs. °Grains °Whole grain or whole wheat bread. Brown rice. Whole grain or whole wheat pasta. Quinoa, bulgur, and whole grain cereals. Low-sodium cereals. Corn or whole wheat flour tortillas. Whole grain cornbread. Whole grain crackers. Low-sodium crackers. °Vegetables °Fresh or frozen vegetables  (raw, steamed, roasted, or grilled). Low-sodium or reduced-sodium tomato and vegetable juices. Low-sodium or reduced-sodium tomato sauce and paste. Low-sodium or reduced-sodium canned vegetables.  °Fruits °All fresh, canned (in natural juice), or frozen fruits. °Meat and Other Protein Products °Ground beef (85% or leaner), grass-fed beef, or beef trimmed of fat. Skinless chicken or turkey. Ground chicken or turkey. Pork trimmed of fat. All fish and seafood. Eggs. Dried beans, peas, or lentils. Unsalted nuts and seeds. Unsalted canned beans. °Dairy °Low-fat dairy products, such as skim or 1% milk, 2% or reduced-fat cheeses, low-fat ricotta or cottage cheese, or plain low-fat yogurt. Low-sodium or reduced-sodium cheeses. °Fats and Oils °Tub margarines without trans fats. Light or reduced-fat mayonnaise and salad dressings (reduced sodium). Avocado. Safflower, olive, or canola oils. Natural peanut or almond butter. °Other °Unsalted popcorn and pretzels. °The items listed above may not be a complete list of recommended foods or beverages. Contact your dietitian for more options. °WHAT FOODS ARE NOT RECOMMENDED? °Grains °White bread. White pasta. White rice. Refined cornbread. Bagels and croissants. Crackers that contain trans fat. °Vegetables °Creamed or fried vegetables. Vegetables in a cheese sauce. Regular canned vegetables. Regular canned tomato sauce and paste. Regular tomato and vegetable juices. °Fruits °Dried fruits. Canned fruit in light or heavy syrup. Fruit juice. °Meat and Other Protein Products °Fatty cuts of meat. Ribs, chicken wings, bacon, sausage, bologna, salami, chitterlings, fatback, hot dogs, bratwurst, and packaged luncheon meats. Salted nuts and seeds. Canned beans with salt. °Dairy °Whole or 2% milk, cream, half-and-half, and cream cheese. Whole-fat or sweetened yogurt. Full-fat   cheeses or blue cheese. Nondairy creamers and whipped toppings. Processed cheese, cheese spreads, or cheese  curds. °Condiments °Onion and garlic salt, seasoned salt, table salt, and sea salt. Canned and packaged gravies. Worcestershire sauce. Tartar sauce. Barbecue sauce. Teriyaki sauce. Soy sauce, including reduced sodium. Steak sauce. Fish sauce. Oyster sauce. Cocktail sauce. Horseradish. Ketchup and mustard. Meat flavorings and tenderizers. Bouillon cubes. Hot sauce. Tabasco sauce. Marinades. Taco seasonings. Relishes. °Fats and Oils °Butter, stick margarine, lard, shortening, ghee, and bacon fat. Coconut, palm kernel, or palm oils. Regular salad dressings. °Other °Pickles and olives. Salted popcorn and pretzels. °The items listed above may not be a complete list of foods and beverages to avoid. Contact your dietitian for more information. °WHERE CAN I FIND MORE INFORMATION? °National Heart, Lung, and [employer] Institute: www.nhlbi.nih.gov/health/health-topics/topics/dash/ °Document Released: 10/08/2011 Document Revised: 03/05/2014 Document Reviewed: 08/23/2013 °ExitCare® Patient Information ©2015 [employer]. This information is not intended to replace advice given to you by your health care provider. Make sure you discuss any questions you have with your health care provider. ° °

## 2014-12-06 LAB — HEMOGLOBIN A1C
Hgb A1c MFr Bld: 5.8 % — ABNORMAL HIGH (ref <5.7)
Mean Plasma Glucose: 120 mg/dL — ABNORMAL HIGH (ref <117)

## 2014-12-06 LAB — VITAMIN D 25 HYDROXY (VIT D DEFICIENCY, FRACTURES): VIT D 25 HYDROXY: 13 ng/mL — AB (ref 30–100)

## 2014-12-06 LAB — TSH: TSH: 2.37 u[IU]/mL (ref 0.350–4.500)

## 2014-12-07 ENCOUNTER — Telehealth: Payer: Self-pay

## 2014-12-07 MED ORDER — VITAMIN D (ERGOCALCIFEROL) 1.25 MG (50000 UNIT) PO CAPS: 50000.0000 [IU] | ORAL_CAPSULE | ORAL | Status: DC

## 2014-12-07 NOTE — Telephone Encounter (Signed)
-----   Message from Doris Cheadleeepak Advani, MD sent at 12/06/2014  9:10 AM EST ----- Blood work reviewed, noticed low vitamin D, call patient advise to start ergocalciferol 50,000 units once a week for the duration of  12 weeks.  noticed hemoglobin A1c of 5.8%, patient has prediabetes, call and advise patient for low carbohydrate diet.

## 2014-12-07 NOTE — Telephone Encounter (Signed)
Returned patient phone call Patient is aware of her labs and prescription resent To [facility] [address]

## 2014-12-07 NOTE — Telephone Encounter (Signed)
Rx send to [facility] LVM to return call

## 2015-07-09 ENCOUNTER — Other Ambulatory Visit: Payer: Self-pay

## 2015-07-09 DIAGNOSIS — Z1231 Encounter for screening mammogram for malignant neoplasm of breast: Secondary | ICD-10-CM

## 2015-08-15 ENCOUNTER — Ambulatory Visit
Admission: RE | Admit: 2015-08-15 | Discharge: 2015-08-15 | Disposition: A | Discharge status: Home / Self Care | Payer: 59 | Source: Ambulatory Visit

## 2015-08-15 DIAGNOSIS — Z1231 Encounter for screening mammogram for malignant neoplasm of breast: Secondary | ICD-10-CM

## 2016-03-17 ENCOUNTER — Encounter: Payer: Self-pay | Admitting: Family Medicine

## 2016-07-20 ENCOUNTER — Other Ambulatory Visit: Payer: Self-pay | Admitting: Specialist

## 2016-07-20 DIAGNOSIS — Z1231 Encounter for screening mammogram for malignant neoplasm of breast: Secondary | ICD-10-CM

## 2016-08-05 ENCOUNTER — Ambulatory Visit (INDEPENDENT_AMBULATORY_CARE_PROVIDER_SITE_OTHER): Payer: Self-pay | Admitting: Orthopedic Surgery

## 2016-08-05 ENCOUNTER — Ambulatory Visit (INDEPENDENT_AMBULATORY_CARE_PROVIDER_SITE_OTHER): Payer: BLUE CROSS/BLUE SHIELD | Admitting: Orthopedic Surgery

## 2016-08-05 DIAGNOSIS — M1712 Unilateral primary osteoarthritis, left knee: Secondary | ICD-10-CM

## 2016-08-05 DIAGNOSIS — M1711 Unilateral primary osteoarthritis, right knee: Secondary | ICD-10-CM

## 2016-08-12 ENCOUNTER — Ambulatory Visit (INDEPENDENT_AMBULATORY_CARE_PROVIDER_SITE_OTHER): Payer: BLUE CROSS/BLUE SHIELD | Admitting: Orthopedic Surgery

## 2016-08-12 DIAGNOSIS — M1712 Unilateral primary osteoarthritis, left knee: Secondary | ICD-10-CM | POA: Diagnosis not present

## 2016-08-12 DIAGNOSIS — M1711 Unilateral primary osteoarthritis, right knee: Secondary | ICD-10-CM

## 2016-08-17 ENCOUNTER — Ambulatory Visit
Admission: RE | Admit: 2016-08-17 | Discharge: 2016-08-17 | Disposition: A | Discharge status: Home / Self Care | Payer: BLUE CROSS/BLUE SHIELD | Source: Ambulatory Visit | Attending: Specialist | Admitting: Specialist

## 2016-08-17 DIAGNOSIS — Z1231 Encounter for screening mammogram for malignant neoplasm of breast: Secondary | ICD-10-CM

## 2017-02-24 ENCOUNTER — Encounter: Payer: Self-pay | Admitting: Specialist

## 2017-03-04 ENCOUNTER — Encounter: Payer: Self-pay | Admitting: Gastroenterology

## 2017-03-24 LAB — GLUCOSE, POCT (MANUAL RESULT ENTRY): POC GLUCOSE: 121 mg/dL — AB (ref 70–99)

## 2017-03-30 ENCOUNTER — Ambulatory Visit (AMBULATORY_SURGERY_CENTER): Payer: Self-pay

## 2017-03-30 ENCOUNTER — Encounter: Payer: Self-pay | Admitting: Gastroenterology

## 2017-03-30 VITALS — Ht 66.0 in | Wt 212.6 lb

## 2017-03-30 DIAGNOSIS — Z1211 Encounter for screening for malignant neoplasm of colon: Secondary | ICD-10-CM

## 2017-03-30 MED ORDER — NA SULFATE-K SULFATE-MG SULF 17.5-3.13-1.6 GM/177ML PO SOLN: 1.0000 | Freq: Once | ORAL | 0 refills | Status: AC

## 2017-03-30 NOTE — Progress Notes (Signed)
Denies allergies to eggs or soy products. Denies complication of anesthesia or sedation. Denies use of weight loss medication. Denies use of O2.   Emmi instructions given for colonoscopy.  

## 2017-04-06 ENCOUNTER — Telehealth: Payer: Self-pay | Admitting: Gastroenterology

## 2017-04-06 NOTE — Telephone Encounter (Signed)
Spoke with pt. Unable to get prior authorization for Suprep. Pt was informed to come to the 4th floor to pick up a sample. Suprep sample Lot#  [id number]     Exp 05/20 was left at the front desk. The pt will come pick it up today and call back if she has any questions.

## 2017-04-13 ENCOUNTER — Encounter: Payer: Self-pay | Admitting: Gastroenterology

## 2017-04-13 ENCOUNTER — Ambulatory Visit (AMBULATORY_SURGERY_CENTER): Payer: BLUE CROSS/BLUE SHIELD | Admitting: Gastroenterology

## 2017-04-13 VITALS — BP 128/70 | HR 58 | Temp 97.8°F | Resp 10 | Ht 66.0 in | Wt 212.0 lb

## 2017-04-13 DIAGNOSIS — Z1211 Encounter for screening for malignant neoplasm of colon: Secondary | ICD-10-CM | POA: Diagnosis present

## 2017-04-13 DIAGNOSIS — Z1212 Encounter for screening for malignant neoplasm of rectum: Secondary | ICD-10-CM | POA: Diagnosis not present

## 2017-04-13 MED ORDER — SODIUM CHLORIDE 0.9 % IV SOLN
500.0000 mL | INTRAVENOUS | Status: AC
Start: 2017-04-13 — End: ?

## 2017-04-13 NOTE — Progress Notes (Signed)
Pt's states no medical or surgical changes since previsit or office visit. 

## 2017-04-13 NOTE — Progress Notes (Signed)
Alert and oriented x3, pleased with MAC, report to RN Opal Sidles

## 2017-04-13 NOTE — Op Note (Signed)
[facility] Patient Name: Angel Ramos Procedure Date: 04/13/2017 2:18 PM MRN: [id number] Endoscopist: Sherilyn CooterHenry L. Myrtie Neitheranis , MD Age: 56 Referring MD:  Date of Birth: 06/04/1961 Gender: Female Account #: [id number] Procedure:                Colonoscopy Indications:              Screening for colorectal malignant neoplasm, This                            is the patient's first colonoscopy Medicines:                Monitored Anesthesia Care Procedure:                Pre-Anesthesia Assessment:                           - Prior to the procedure, a History and Physical                            was performed, and patient medications and                            allergies were reviewed. The patient's tolerance of                            previous anesthesia was also reviewed. The risks                            and benefits of the procedure and the sedation                            options and risks were discussed with the patient.                            All questions were answered, and informed consent                            was obtained. Prior Anticoagulants: The patient has                            taken no previous anticoagulant or antiplatelet                            agents. ASA Grade Assessment: II - A patient with                            mild systemic disease. After reviewing the risks                            and benefits, the patient was deemed in                            satisfactory condition to undergo the procedure.  After obtaining informed consent, the colonoscope                            was passed under direct vision. Throughout the                            procedure, the patient's blood pressure, pulse, and                            oxygen saturations were monitored continuously. The                            Model CF-HQ190L [id number]) scope was introduced                            through the anus and  advanced to the the cecum,                            identified by appendiceal orifice and ileocecal                            valve. The colonoscopy was performed without                            difficulty. The patient tolerated the procedure                            well. The quality of the bowel preparation was                            excellent. The ileocecal valve, appendiceal                            orifice, and rectum were photographed. The quality                            of the bowel preparation was evaluated using the                            BBPS [facility] Bowel Preparation Scale) with scores                            of: Right Colon = 3, Transverse Colon = 3 and Left                            Colon = 3 (entire mucosa seen well with no residual                            staining, small fragments of stool or opaque                            liquid). The total BBPS score equals 9. The bowel  preparation used was SUPREP. Scope In: 2:35:37 PM Scope Out: 2:46:18 PM Scope Withdrawal Time: 0 hours 7 minutes 28 seconds  Total Procedure Duration: 0 hours 10 minutes 41 seconds  Findings:                 The perianal and digital rectal examinations were                            normal.                           The entire examined colon appeared normal on direct                            and retroflexion views. Complications:            No immediate complications. Estimated Blood Loss:     Estimated blood loss: none. Impression:               - The entire examined colon is normal on direct and                            retroflexion views.                           - No specimens collected. Recommendation:           - Patient has a contact number available for                            emergencies. The signs and symptoms of potential                            delayed complications were discussed with the                             patient. Return to normal activities tomorrow.                            Written discharge instructions were provided to the                            patient.                           - Resume previous diet.                           - Continue present medications.                           - Repeat colonoscopy in 10 years for screening                            purposes. [name] L. Myrtie Neither, MD 04/13/2017 2:48:57 PM This report has been signed electronically.

## 2017-04-13 NOTE — Patient Instructions (Signed)
YOU HAD AN ENDOSCOPIC PROCEDURE TODAY AT THE [facility]:   Refer to the procedure report that was given to you for any specific questions about what was found during the examination.  If the procedure report does not answer your questions, please call your gastroenterologist to clarify.  If you requested that your care partner not be given the details of your procedure findings, then the procedure report has been included in a sealed envelope for you to review at your convenience later.  YOU SHOULD EXPECT: Some feelings of bloating in the abdomen. Passage of more gas than usual.  Walking can help get rid of the air that was put into your GI tract during the procedure and reduce the bloating. If you had a lower endoscopy (such as a colonoscopy or flexible sigmoidoscopy) you may notice spotting of blood in your stool or on the toilet paper. If you underwent a bowel prep for your procedure, you may not have a normal bowel movement for a few days.  Please Note:  You might notice some irritation and congestion in your nose or some drainage.  This is from the oxygen used during your procedure.  There is no need for concern and it should clear up in a day or so.  SYMPTOMS TO REPORT IMMEDIATELY:   Following lower endoscopy (colonoscopy or flexible sigmoidoscopy):  Excessive amounts of blood in the stool  Significant tenderness or worsening of abdominal pains  Swelling of the abdomen that is new, acute  Fever of 100F or higher   For urgent or emergent issues, a gastroenterologist can be reached at any hour by calling [phone].   DIET:  We do recommend a small meal at first, but then you may proceed to your regular diet.  Drink plenty of fluids but you should avoid alcoholic beverages for 24 hours.  ACTIVITY:  You should plan to take it easy for the rest of today and you should NOT DRIVE or use heavy machinery until tomorrow (because of the sedation medicines used during the test).     FOLLOW UP: Our staff will call the number listed on your records the next business day following your procedure to check on you and address any questions or concerns that you may have regarding the information given to you following your procedure. If we do not reach you, we will leave a message.  However, if you are feeling well and you are not experiencing any problems, there is no need to return our call.  We will assume that you have returned to your regular daily activities without incident.  If any biopsies were taken you will be contacted by phone or by letter within the next 1-3 weeks.  Please call us at [phone] if you have not heard about the biopsies in 3 weeks.    SIGNATURES/CONFIDENTIALITY: You and/or your care partner have signed paperwork which will be entered into your electronic medical record.  These signatures attest to the fact that that the information above on your After Visit Summary has been reviewed and is understood.  Full responsibility of the confidentiality of this discharge information lies with you and/or your care-partner.  Next colonoscopy 10 years-2028.

## 2017-04-14 ENCOUNTER — Telehealth: Payer: Self-pay | Admitting: *Deleted

## 2017-04-14 NOTE — Telephone Encounter (Signed)
  Follow up Call-  Call back number 04/13/2017  Post procedure Call Back phone  # [phone]  Permission to leave phone message Yes  Some recent data might be hidden     Patient questions:  Do you have a fever, pain , or abdominal swelling? No. Pain Score  0 *  Have you tolerated food without any problems? Yes   Have you been able to return to your normal activities? Yes.    Do you have any questions about your discharge instructions: Diet   No. Medications  No. Follow up visit  no  Do you have questions or concerns about your Care? No.  Actions: * If pain score is 4 or above: No action needed, pain <4.

## 2017-07-12 ENCOUNTER — Other Ambulatory Visit: Payer: Self-pay | Admitting: Specialist

## 2017-07-12 DIAGNOSIS — Z1231 Encounter for screening mammogram for malignant neoplasm of breast: Secondary | ICD-10-CM

## 2017-08-19 ENCOUNTER — Ambulatory Visit
Admission: RE | Admit: 2017-08-19 | Discharge: 2017-08-19 | Disposition: A | Discharge status: Home / Self Care | Payer: BLUE CROSS/BLUE SHIELD | Source: Ambulatory Visit | Attending: Specialist | Admitting: Specialist

## 2017-08-19 DIAGNOSIS — Z1231 Encounter for screening mammogram for malignant neoplasm of breast: Secondary | ICD-10-CM

## 2018-04-05 ENCOUNTER — Encounter (INDEPENDENT_AMBULATORY_CARE_PROVIDER_SITE_OTHER): Payer: BLUE CROSS/BLUE SHIELD | Admitting: Podiatry

## 2018-04-05 ENCOUNTER — Ambulatory Visit: Payer: BLUE CROSS/BLUE SHIELD

## 2018-04-05 DIAGNOSIS — M2042 Other hammer toe(s) (acquired), left foot: Secondary | ICD-10-CM

## 2018-04-05 NOTE — Progress Notes (Signed)
This encounter was created in error - please disregard.

## 2018-04-06 ENCOUNTER — Ambulatory Visit: Payer: BLUE CROSS/BLUE SHIELD | Admitting: Podiatry

## 2018-07-11 ENCOUNTER — Other Ambulatory Visit: Payer: Self-pay | Admitting: Specialist

## 2018-07-11 DIAGNOSIS — Z1231 Encounter for screening mammogram for malignant neoplasm of breast: Secondary | ICD-10-CM

## 2018-08-23 ENCOUNTER — Ambulatory Visit
Admission: RE | Admit: 2018-08-23 | Discharge: 2018-08-23 | Disposition: A | Discharge status: Home / Self Care | Payer: BLUE CROSS/BLUE SHIELD | Source: Ambulatory Visit | Attending: Specialist | Admitting: Specialist

## 2018-08-23 DIAGNOSIS — Z1231 Encounter for screening mammogram for malignant neoplasm of breast: Secondary | ICD-10-CM

## 2019-07-14 ENCOUNTER — Other Ambulatory Visit: Payer: Self-pay | Admitting: Specialist

## 2019-07-14 DIAGNOSIS — Z1231 Encounter for screening mammogram for malignant neoplasm of breast: Secondary | ICD-10-CM

## 2020-08-23 ENCOUNTER — Other Ambulatory Visit: Payer: Self-pay | Admitting: Specialist

## 2020-08-23 DIAGNOSIS — Z1231 Encounter for screening mammogram for malignant neoplasm of breast: Secondary | ICD-10-CM

## 2020-08-24 ENCOUNTER — Ambulatory Visit
Admission: RE | Admit: 2020-08-24 | Discharge: 2020-08-24 | Disposition: A | Discharge status: Home / Self Care | Payer: BLUE CROSS/BLUE SHIELD | Source: Ambulatory Visit | Attending: Specialist | Admitting: Specialist

## 2020-08-24 ENCOUNTER — Other Ambulatory Visit: Payer: Self-pay

## 2020-08-24 DIAGNOSIS — Z1231 Encounter for screening mammogram for malignant neoplasm of breast: Secondary | ICD-10-CM

## 2021-06-06 ENCOUNTER — Ambulatory Visit: Payer: Self-pay

## 2021-06-06 ENCOUNTER — Encounter: Payer: Self-pay | Admitting: Family

## 2021-06-06 ENCOUNTER — Ambulatory Visit (INDEPENDENT_AMBULATORY_CARE_PROVIDER_SITE_OTHER): Payer: BLUE CROSS/BLUE SHIELD | Admitting: Family

## 2021-06-06 ENCOUNTER — Other Ambulatory Visit: Payer: Self-pay

## 2021-06-06 DIAGNOSIS — M25561 Pain in right knee: Secondary | ICD-10-CM

## 2021-06-06 DIAGNOSIS — G8929 Other chronic pain: Secondary | ICD-10-CM

## 2021-06-06 DIAGNOSIS — M25562 Pain in left knee: Secondary | ICD-10-CM

## 2021-06-06 NOTE — Progress Notes (Signed)
Office Visit Note   Patient: Angel Ramos           Date of Birth: 02-22-1961           MRN: [id number] Visit Date: 06/06/2021              Requested by: Care, [facility] [address] [place],  [place] [address] PCP: [facility], [facility]  No chief complaint on file.     HPI: Patient is a 60 year old woman who presents today for evaluation of bilateral knees.  She has had chronic knee pain greater than 1 year.  Primarily over the medial joint line.  Right worse than left.  She has a limping gait.  She is concerned she has recently gotten a new job which will require her to stand she is concerned about standing due to her knee pain.  She has had locking and giving way   She was previously followed by Dr. Valentina Gu.  She has significant osteoarthritis.  Has had cortisone injections with diminishing relief.  She did have a supplemental injection about a year ago she states this provided her about 6 months of relief.  Is requesting supplemental injection again today.  Assessment & Plan: Visit Diagnoses: No diagnosis found.  Plan: Depo-Medrol injection bilateral knees.  Patient tolerated well.  Will send in for prior authorization supplemental injection bilateral knees  Follow-Up Instructions: No follow-ups on file.   Right Knee Exam   Muscle Strength  The patient has normal right knee strength.  Tenderness  The patient is experiencing tenderness in the medial joint line.  Range of Motion  The patient has normal right knee ROM.  Tests  Varus: negative Valgus: negative  Other  Swelling: mild Effusion: no effusion present   Left Knee Exam   Muscle Strength  The patient has normal left knee strength.  Tenderness  The patient is experiencing tenderness in the medial joint line.  Range of Motion  The patient has normal left knee ROM.  Tests  Varus: negative Valgus: negative  Other  Swelling: mild Effusion: no effusion  present     Patient is alert, oriented, no adenopathy, well-dressed, normal affect, normal respiratory effort.   Imaging: No results found. No images are attached to the encounter.  Labs: Lab Results  Component Value Date   HGBA1C 5.8 (H) 12/05/2014     Lab Results  Component Value Date   ALBUMIN 3.9 12/05/2014    No results found for: MG Lab Results  Component Value Date   VD25OH 13 (L) 12/05/2014    No results found for: PREALBUMIN CBC EXTENDED Latest Ref Rng & Units 12/05/2014  WBC 4.0 - 10.5 K/uL 8.7  RBC 3.87 - 5.11 MIL/uL 4.57  HGB 12.0 - 15.0 g/dL 30.0  HCT 51.1 - 02.1 % 39.6  PLT 150 - 400 K/uL 307  NEUTROABS 1.7 - 7.7 K/uL 4.1  LYMPHSABS 0.7 - 4.0 K/uL 4.0     There is no height or weight on file to calculate BMI.  Orders:  No orders of the defined types were placed in this encounter.  No orders of the defined types were placed in this encounter.    Procedures: No procedures performed  Clinical Data: No additional findings.  ROS:  All other systems negative, except as noted in the HPI. Review of Systems  Constitutional: Negative.   Cardiovascular:  Negative for leg swelling.  Musculoskeletal:  Positive for arthralgias, gait  problem and joint swelling.   Objective: Vital Signs: There were no vitals taken for this visit.  Specialty Comments:  No specialty comments available.  PMFS History: Patient Active Problem List   Diagnosis Date Noted   Essential hypertension 12/05/2014   Family history of diabetes mellitus (DM) 12/05/2014   Bilateral knee pain 12/05/2014   Past Medical History:  Diagnosis Date   Arthritis    Cataract    Hypertension     Family History  Problem Relation Age of Onset   Hypertension Mother    Diabetes Father    Heart disease Father    Cancer Maternal Grandmother        cervical cancer    Colon cancer Neg Hx    Esophageal cancer Neg Hx    Rectal cancer Neg Hx    Stomach cancer Neg Hx     Past  Surgical History:  Procedure Laterality Date   ABDOMINAL HYSTERECTOMY     Social History   Occupational History   Not on file  Tobacco Use   Smoking status: Never   Smokeless tobacco: Never  Substance and Sexual Activity   Alcohol use: Yes    Alcohol/week: 0.0 standard drinks    Comment: occasionally   Drug use: Not on file   Sexual activity: Not on file

## 2021-07-21 ENCOUNTER — Telehealth: Payer: Self-pay

## 2021-07-21 NOTE — Telephone Encounter (Signed)
Called patient to discuss about gel injections, due to not having insurance, but patient stated that she will call back in the morning.

## 2021-09-11 ENCOUNTER — Other Ambulatory Visit: Payer: Self-pay | Admitting: Specialist

## 2021-09-11 DIAGNOSIS — Z139 Encounter for screening, unspecified: Secondary | ICD-10-CM

## 2021-09-13 ENCOUNTER — Other Ambulatory Visit: Payer: Self-pay | Admitting: Internal Medicine

## 2021-09-13 ENCOUNTER — Other Ambulatory Visit: Payer: Self-pay

## 2021-09-13 ENCOUNTER — Ambulatory Visit
Admission: RE | Admit: 2021-09-13 | Discharge: 2021-09-13 | Disposition: A | Discharge status: Home / Self Care | Payer: Self-pay | Source: Ambulatory Visit | Attending: Specialist | Admitting: Specialist

## 2021-09-13 DIAGNOSIS — Z139 Encounter for screening, unspecified: Secondary | ICD-10-CM

## 2021-10-15 ENCOUNTER — Ambulatory Visit: Payer: Self-pay | Admitting: Physician Assistant

## 2021-10-15 ENCOUNTER — Other Ambulatory Visit: Payer: Self-pay

## 2021-10-15 DIAGNOSIS — I10 Essential (primary) hypertension: Secondary | ICD-10-CM

## 2021-10-15 DIAGNOSIS — U071 COVID-19: Secondary | ICD-10-CM

## 2021-10-15 MED ORDER — AZITHROMYCIN 250 MG PO TABS: ORAL_TABLET | ORAL | 0 refills | Status: DC

## 2021-10-15 MED ORDER — BENZONATATE 100 MG PO CAPS
100.0000 mg | ORAL_CAPSULE | Freq: Two times a day (BID) | ORAL | 0 refills | Status: DC | PRN
Start: 2021-10-15 — End: 2022-04-30

## 2021-10-15 MED ORDER — HYDROCHLOROTHIAZIDE 25 MG PO TABS: 25.0000 mg | ORAL_TABLET | Freq: Every day | ORAL | 0 refills | Status: DC

## 2021-10-15 NOTE — Progress Notes (Signed)
Patient reports URI symptoms being present for 2 days. Patient denies N/V Diarrhea last occurred this morning. Patient reports low appetite but is able to drink.  Patient has not used medications to aid in the Sx. Patient denies fevers. Patient has been out of BP medication for 4-5 months.

## 2021-10-15 NOTE — Progress Notes (Signed)
New Patient Office Visit  Subjective:  Patient ID: Angel Ramos, female    DOB: 05-21-1961  Age: 60 y.o. MRN: [id number]  CC:  Chief Complaint  Patient presents with   URI   Virtual Visit via Telephone Note  I connected with Angel Ramos on 10/15/21 at  3:00 PM EST by telephone and verified that I am speaking with the correct person using two identifiers.  Location: Patient: Vehicle Provider: [facility]    I discussed the limitations, risks, security and privacy concerns of performing an evaluation and management service by telephone and the availability of in person appointments. I also discussed with the patient that there may be a patient responsible charge related to this service. The patient expressed understanding and agreed to proceed.   History of Present Illness: States that she started feeling poorly yesterday Dy cough, runny nose, overall fatigue, diarhhea, unmeasured fever,  chills, states that she has a low appetite but is drinking plenty of water.  Denies sick contacts.  States that she has had 2 COVID vaccines, these occurred back in early 2021, no recent boosters.  States that she has been out of her blood pressure medication for the past 4 to 5 months, states that clinic she was going to close and she did not have access to get refills of states that she does have an appointment to establish care at a new clinic, reports that she does check her blood pressure at home, states last time she checked it was approximately 2 weeks ago and her blood pressure reading was approximately 160/90.  Denies any hypertensive      Observations/Objective: Medical history and current medications reviewed, no physical exam completed    Past Medical History:  Diagnosis Date   Arthritis    Cataract    Hypertension     Past Surgical History:  Procedure Laterality Date   ABDOMINAL HYSTERECTOMY      Family History  Problem Relation Age of  Onset   Hypertension Mother    Diabetes Father    Heart disease Father    Cancer Maternal Grandmother        cervical cancer    Colon cancer Neg Hx    Esophageal cancer Neg Hx    Rectal cancer Neg Hx    Stomach cancer Neg Hx     Social History   Socioeconomic History   Marital status: Single    Spouse name: Not on file   Number of children: Not on file   Years of education: Not on file   Highest education level: Not on file  Occupational History   Not on file  Tobacco Use   Smoking status: Never   Smokeless tobacco: Never  Substance and Sexual Activity   Alcohol use: Yes    Alcohol/week: 0.0 standard drinks    Comment: occasionally   Drug use: Not on file   Sexual activity: Not Currently  Other Topics Concern   Not on file  Social History Narrative   Not on file   Social Determinants of Health   Financial Resource Strain: Not on file  Food Insecurity: Not on file  Transportation Needs: Not on file  Physical Activity: Not on file  Stress: Not on file  Social Connections: Not on file  Intimate Partner Violence: Not on file    ROS Review of Systems  Constitutional:  Positive for chills and fever.  HENT:  Positive for congestion and rhinorrhea. Negative for ear  pain, sinus pressure, sore throat and trouble swallowing.   Eyes: Negative.   Respiratory:  Positive for cough. Negative for shortness of breath and wheezing.   Cardiovascular:  Negative for chest pain.  Gastrointestinal:  Positive for diarrhea. Negative for abdominal pain, nausea and vomiting.  Endocrine: Negative.   Genitourinary: Negative.   Musculoskeletal:  Positive for myalgias.  Skin: Negative.   Allergic/Immunologic: Negative.   Neurological: Negative.   Hematological: Negative.   Psychiatric/Behavioral: Negative.     Objective:   Today's Vitals: There were no vitals taken for this visit.    Assessment & Plan:   Problem List Items Addressed This Visit       Cardiovascular and  Mediastinum   Essential hypertension   Relevant Medications   hydrochlorothiazide (HYDRODIURIL) 25 MG tablet   Other Visit Diagnoses     COVID    -  Primary   Relevant Medications   azithromycin (ZITHROMAX) 250 MG tablet   benzonatate (TESSALON) 100 MG capsule       Outpatient Encounter Medications as of 10/15/2021  Medication Sig   azithromycin (ZITHROMAX) 250 MG tablet Take 2 tabs PO day 1, then take 1 tab PO once daily   benzonatate (TESSALON) 100 MG capsule Take 1 capsule (100 mg total) by mouth 2 (two) times daily as needed for cough.   atenolol (TENORMIN) 25 MG tablet Take by mouth daily. (Patient not taking: Reported on 10/15/2021)   hydrochlorothiazide (HYDRODIURIL) 25 MG tablet Take 1 tablet (25 mg total) by mouth daily.   [DISCONTINUED] hydrochlorothiazide (HYDRODIURIL) 25 MG tablet Take 25 mg by mouth daily. (Patient not taking: Reported on 10/15/2021)   Facility-Administered Encounter Medications as of 10/15/2021  Medication   0.9 %  sodium chloride infusion   Assessment and Plan:  1. COVID Rapid COVID test positive.  No recent renal function test available.  Trial azithromycin, Tessalon Perles.  Patient education given on supportive care, proper over-the-counter cold medications.  Angel flags given for prompt reevaluation. - azithromycin (ZITHROMAX) 250 MG tablet; Take 2 tabs PO day 1, then take 1 tab PO once daily  Dispense: 6 tablet; Refill: 0 - benzonatate (TESSALON) 100 MG capsule; Take 1 capsule (100 mg total) by mouth 2 (two) times daily as needed for cough.  Dispense: 20 capsule; Refill: 0  2. Essential hypertension Restart hydrochlorothiazide, patient encouraged to check blood pressure on a daily basis, keep a written log and have available for all office visits.  Patient encouraged to return to mobile unit if blood pressure readings remain elevated or follow-up with new primary care provider.  Patient understands and agrees. - hydrochlorothiazide (HYDRODIURIL)  25 MG tablet; Take 1 tablet (25 mg total) by mouth daily.  Dispense: 30 tablet; Refill: 0  Follow Up Instructions:    I discussed the assessment and treatment plan with the patient. The patient was provided an opportunity to ask questions and all were answered. The patient agreed with the plan and demonstrated an understanding of the instructions.   The patient was advised to call back or seek an in-person evaluation if the symptoms worsen or if the condition fails to improve as anticipated.  I provided 21 minutes of non-face-to-face time during this encounter.    Follow-up: Return if symptoms worsen or fail to improve.   Kasandra Knudsen Mayers, PA-C

## 2021-10-15 NOTE — Patient Instructions (Signed)
Your rapid COVID test was positive.  You are going to take azithromycin, and use Tessalon Perles as needed to help you with your cough.  Make sure that you get plenty of rest and drink lots of fluids.  I sent a prescription of your hydrochlorothiazide to your pharmacy, I strongly encourage you to restart this medication.  I encourage you to check your blood pressure at home on a daily basis, keep a written log and have that available for your office visits.  Please feel free to return to the mobile unit for follow-up if needed  I hope that you feel better soon, please let us know if there is any else we can do for you   Roney Jaffe, PA-C Physician Assistant [facility] [email or web address]     Person Under Monitoring Name: Angel Ramos  Location: [address] [place] [address]   Infection Prevention Recommendations for Individuals Confirmed to have, or Being Evaluated for, 2019 Novel Coronavirus (COVID-19) Infection Who Receive Care at Home  Individuals who are confirmed to have, or are being evaluated for, COVID-19 should follow the prevention steps below until a healthcare provider or local or state health department says they can return to normal activities.  Stay home except to get medical care You should restrict activities outside your home, except for getting medical care. Do not go to work, school, or public areas, and do not use public transportation or taxis.  Call ahead before visiting your doctor Before your medical appointment, call the healthcare provider and tell them that you have, or are being evaluated for, COVID-19 infection. This will help the healthcare providers office take steps to keep other people from getting infected. Ask your healthcare provider to call the local or state health department.  Monitor your symptoms Seek prompt medical attention if your illness is worsening (e.g.,  difficulty breathing). Before going to your medical appointment, call the healthcare provider and tell them that you have, or are being evaluated for, COVID-19 infection. Ask your healthcare provider to call the local or state health department.  Wear a facemask You should wear a facemask that covers your nose and mouth when you are in the same room with other people and when you visit a healthcare provider. People who live with or visit you should also wear a facemask while they are in the same room with you.  Separate yourself from other people in your home As much as possible, you should stay in a different room from other people in your home. Also, you should use a separate bathroom, if available.  Avoid sharing household items You should not share dishes, drinking glasses, cups, eating utensils, towels, bedding, or other items with other people in your home. After using these items, you should wash them thoroughly with soap and water.  Cover your coughs and sneezes Cover your mouth and nose with a tissue when you cough or sneeze, or you can cough or sneeze into your sleeve. Throw used tissues in a lined trash can, and immediately wash your hands with soap and water for at least 20 seconds or use an alcohol-based hand rub.  Wash your [employer] your hands often and thoroughly with soap and water for at least 20 seconds. You can use an alcohol-based hand sanitizer if soap and water are not available and if your hands are not visibly dirty. Avoid touching your eyes, nose, and mouth with unwashed hands.   Prevention Steps for Caregivers and Household  Members of Individuals Confirmed to have, or Being Evaluated for, COVID-19 Infection Being Cared for in the Home  If you live with, or provide care at home for, a person confirmed to have, or being evaluated for, COVID-19 infection please follow these guidelines to prevent infection:  Follow healthcare providers instructions Make  sure that you understand and can help the patient follow any healthcare provider instructions for all care.  Provide for the patients basic needs You should help the patient with basic needs in the home and provide support for getting groceries, prescriptions, and other personal needs.  Monitor the patients symptoms If they are getting sicker, call his or her medical provider and tell them that the patient has, or is being evaluated for, COVID-19 infection. This will help the healthcare providers office take steps to keep other people from getting infected. Ask the healthcare provider to call the local or state health department.  Limit the number of people who have contact with the patient If possible, have only one caregiver for the patient. Other household members should stay in another home or place of residence. If this is not possible, they should stay in another room, or be separated from the patient as much as possible. Use a separate bathroom, if available. Restrict visitors who do not have an essential need to be in the home.  Keep older adults, very young children, and other sick people away from the patient Keep older adults, very young children, and those who have compromised immune systems or chronic health conditions away from the patient. This includes people with chronic heart, lung, or kidney conditions, diabetes, and cancer.  Ensure good ventilation Make sure that shared spaces in the home have good air flow, such as from an air conditioner or an opened window, weather permitting.  Wash your hands often Wash your hands often and thoroughly with soap and water for at least 20 seconds. You can use an alcohol based hand sanitizer if soap and water are not available and if your hands are not visibly dirty. Avoid touching your eyes, nose, and mouth with unwashed hands. Use disposable paper towels to dry your hands. If not available, use dedicated cloth towels and replace  them when they become wet.  Wear a facemask and gloves Wear a disposable facemask at all times in the room and gloves when you touch or have contact with the patients blood, body fluids, and/or secretions or excretions, such as sweat, saliva, sputum, nasal mucus, vomit, urine, or feces.  Ensure the mask fits over your nose and mouth tightly, and do not touch it during use. Throw out disposable facemasks and gloves after using them. Do not reuse. Wash your hands immediately after removing your facemask and gloves. If your personal clothing becomes contaminated, carefully remove clothing and launder. Wash your hands after handling contaminated clothing. Place all used disposable facemasks, gloves, and other waste in a lined container before disposing them with other household waste. Remove gloves and wash your hands immediately after handling these items.  Do not share dishes, glasses, or other household items with the patient Avoid sharing household items. You should not share dishes, drinking glasses, cups, eating utensils, towels, bedding, or other items with a patient who is confirmed to have, or being evaluated for, COVID-19 infection. After the person uses these items, you should wash them thoroughly with soap and water.  Wash laundry thoroughly Immediately remove and wash clothes or bedding that have blood, body fluids, and/or secretions or excretions,  such as sweat, saliva, sputum, nasal mucus, vomit, urine, or feces, on them. Wear gloves when handling laundry from the patient. Read and follow directions on labels of laundry or clothing items and detergent. In general, wash and dry with the warmest temperatures recommended on the label.  Clean all areas the individual has used often Clean all touchable surfaces, such as counters, tabletops, doorknobs, bathroom fixtures, toilets, phones, keyboards, tablets, and bedside tables, every day. Also, clean any surfaces that may have blood, body  fluids, and/or secretions or excretions on them. Wear gloves when cleaning surfaces the patient has come in contact with. Use a diluted bleach solution (e.g., dilute bleach with 1 part bleach and 10 parts water) or a household disinfectant with a label that says EPA-registered for coronaviruses. To make a bleach solution at home, add 1 tablespoon of bleach to 1 quart (4 cups) of water. For a larger supply, add  cup of bleach to 1 gallon (16 cups) of water. Read labels of cleaning products and follow recommendations provided on product labels. Labels contain instructions for safe and effective use of the cleaning product including precautions you should take when applying the product, such as wearing gloves or eye protection and making sure you have good ventilation during use of the product. Remove gloves and wash hands immediately after cleaning.  Monitor yourself for signs and symptoms of illness Caregivers and household members are considered close contacts, should monitor their health, and will be asked to limit movement outside of the home to the extent possible. Follow the monitoring steps for close contacts listed on the symptom monitoring form.   ? If you have additional questions, contact your local health department or call the epidemiologist on call at [phone] (available 24/7). ? This guidance is subject to change. For the most up-to-date guidance from [facility], please refer to their website: [email or web address]

## 2021-12-02 ENCOUNTER — Other Ambulatory Visit: Payer: Self-pay

## 2021-12-02 ENCOUNTER — Ambulatory Visit (INDEPENDENT_AMBULATORY_CARE_PROVIDER_SITE_OTHER): Payer: 59 | Admitting: Sports Medicine

## 2021-12-02 VITALS — BP 160/93 | Ht 66.0 in | Wt 205.0 lb

## 2021-12-02 DIAGNOSIS — M170 Bilateral primary osteoarthritis of knee: Secondary | ICD-10-CM | POA: Diagnosis not present

## 2021-12-02 DIAGNOSIS — M25562 Pain in left knee: Secondary | ICD-10-CM | POA: Diagnosis not present

## 2021-12-02 DIAGNOSIS — M25561 Pain in right knee: Secondary | ICD-10-CM | POA: Diagnosis not present

## 2021-12-02 DIAGNOSIS — G8929 Other chronic pain: Secondary | ICD-10-CM

## 2021-12-02 MED ORDER — METHYLPREDNISOLONE ACETATE 40 MG/ML IJ SUSP
40.0000 mg | Freq: Once | INTRAMUSCULAR | Status: AC
  Administered 2021-12-02: 40 mg via INTRA_ARTICULAR

## 2021-12-02 NOTE — Progress Notes (Signed)
° °  Subjective:    Patient ID: Angel Ramos, female    DOB: November 13, 1960, 61 y.o.   MRN: [id number]  HPI chief complaint: Bilateral knee pain  Patient is a very pleasant 61 year old female who comes in requesting bilateral cortisone injections into each knee.  She has a well-documented history of bilateral knee DJD.  She has seen orthopedics in the past for this although she is new to this office. She has had both cortisone injections and viscosupplementation.  She has been told in the past that she needs a knee replacement.  Her pain is diffuse throughout both knees.  Left is worse than the right.  She has difficulty walking and with first getting up from a seated position.  No recent trauma.  Most recent x-rays from June 06, 2021 show bone-on-bone medial compartmental DJD bilaterally.  She denies any previous knee surgeries.  She is currently wearing a compression sleeve on the left knee but is asking if we have a more supportive brace.  Past medical history reviewed Medications reviewed Allergies reviewed    Review of Systems As above    Objective:   Physical Exam  Well-developed, well-nourished.  No acute distress  Left knee: Patient has about a 10 to 15 degree extension lag.  Flexion is to 110 degrees.  1+ boggy synovitis.  Tender to palpation along the medial joint line.  Knee is grossly stable to ligamentous exam.  Right knee: Range of motion is 0 to 120 degrees.  Trace effusion.  She is tender to palpation along the medial joint line.  Knee is grossly stable to ligamentous exam.  X-rays of both knees as above      Assessment & Plan:   Bilateral knee pain secondary to end-stage medial compartmental DJD  Each of the patient's knees were injected with cortisone today.  An anterior lateral approach was utilized.  Patient tolerates this without difficulty.  We will fit her with a double upright brace on the left knee to provide better support.  She is interested in discussing  total knee arthroplasty so we will make referral to Dr. Magnus Ivan.  Follow-up with me as needed.  This note was dictated using Dragon naturally speaking software and may contain errors in syntax, spelling, or content which have not been identified prior to signing this note.   Consent obtained and verified. Time-out conducted. Noted no overlying erythema, induration, or other signs of local infection. Skin prepped in a sterile fashion. Topical analgesic spray: Ethyl chloride. Joint: Right knee Needle: 25-gauge 1.5 inch Completed without difficulty. Meds: 3 cc 1% Xylocaine, 1 cc (40 mg) Depo-Medrol  Advised to call if fevers/chills, erythema, induration, drainage, or persistent bleeding.   Consent obtained and verified. Time-out conducted. Noted no overlying erythema, induration, or other signs of local infection. Skin prepped in a sterile fashion. Topical analgesic spray: Ethyl chloride. Joint: Left knee Needle: 25-gauge 1.5 inch Completed without difficulty. Meds: 3 cc 1% Xylocaine, 1 cc (40 mg) Depo-Medrol  Advised to call if fevers/chills, erythema, induration, drainage, or persistent bleeding.

## 2021-12-09 ENCOUNTER — Ambulatory Visit: Payer: Self-pay | Admitting: Family

## 2021-12-16 ENCOUNTER — Ambulatory Visit: Payer: 59 | Admitting: Orthopaedic Surgery

## 2022-03-31 ENCOUNTER — Ambulatory Visit (INDEPENDENT_AMBULATORY_CARE_PROVIDER_SITE_OTHER): Payer: 59 | Admitting: Sports Medicine

## 2022-03-31 VITALS — BP 136/84 | Ht 66.0 in | Wt 210.0 lb

## 2022-03-31 DIAGNOSIS — M25562 Pain in left knee: Secondary | ICD-10-CM

## 2022-03-31 DIAGNOSIS — M170 Bilateral primary osteoarthritis of knee: Secondary | ICD-10-CM

## 2022-03-31 DIAGNOSIS — M25561 Pain in right knee: Secondary | ICD-10-CM

## 2022-03-31 DIAGNOSIS — G8929 Other chronic pain: Secondary | ICD-10-CM

## 2022-03-31 MED ORDER — METHYLPREDNISOLONE ACETATE 40 MG/ML IJ SUSP
40.0000 mg | Freq: Once | INTRAMUSCULAR | Status: AC
Start: 2022-03-31 — End: 2022-03-31
  Administered 2022-03-31: 40 mg via INTRA_ARTICULAR

## 2022-03-31 MED ORDER — METHYLPREDNISOLONE ACETATE 40 MG/ML IJ SUSP
40.0000 mg | Freq: Once | INTRAMUSCULAR | Status: AC
  Administered 2022-03-31: 40 mg via INTRA_ARTICULAR

## 2022-03-31 NOTE — Progress Notes (Signed)
   Subjective:    Patient ID: Angel Ramos, female    DOB: [birth year], 61 y.o.   MRN: [id number]  HPI chief complaint: Bilateral knee pain  Patient presents today requesting repeat cortisone injections into each knee.  She has a well-documented history of bone-on-bone DJD in each knee.  Last injections were administered in January.  These did provide her with positive but temporary pain relief.  Pain has now returned and is identical in nature to what she has experienced previously.  Although we had made a referral to Dr. Magnus Ivan in January to discuss total knee arthroplasty, patient wanted to wait to see how things progressed.  She is now ready to meet in consultation with him.    Review of Systems As above    Objective:   Physical Exam  Well-developed, well-nourished.  No acute distress  Examination of the right knee shows an extension lag of about 5 degrees.  Flexion is to 120 degrees.  1+ boggy synovitis.  No effusion.  Knee is grossly stable to ligamentous exam.  Neurovascular intact distally.  Examination of the left knee shows range of motion from 0 to 120 degrees.  1+ boggy synovitis.  No effusion.  Knee is grossly stable ligamentous exam.  Neurovascular intact distally.      Assessment & Plan:   Returning bilateral knee pain secondary to end-stage DJD  Each of the patient's knees are injected today with cortisone.  An anterior lateral approach is utilized.  She tolerates this without difficulty.  I encouraged her to see Dr. Magnus Ivan to discuss total knee arthroplasty.  I also provided her with a 72-month temporary handicap placard.  She will follow-up as needed.  Consent obtained and verified. Time-out conducted. Noted no overlying erythema, induration, or other signs of local infection. Skin prepped in a sterile fashion. Topical analgesic spray: Ethyl chloride. Joint: Left knee Needle: 25-gauge 1.5 inch Completed without difficulty. Meds: 3 cc 1% Xylocaine, 1 cc (40  mg) Depo-Medrol  Advised to call if fevers/chills, erythema, induration, drainage, or persistent bleeding.   Consent obtained and verified. Time-out conducted. Noted no overlying erythema, induration, or other signs of local infection. Skin prepped in a sterile fashion. Topical analgesic spray: Ethyl chloride. Joint: Right knee Needle: 25-gauge 1.5 inch Completed without difficulty. Meds: 3 cc 1% Xylocaine, 1 cc (40 mg) Depo-Medrol  Advised to call if fevers/chills, erythema, induration, drainage, or persistent bleeding.   This note was dictated using Dragon naturally speaking software and may contain errors in syntax, spelling, or content which have not been identified prior to signing this note.

## 2022-04-08 ENCOUNTER — Ambulatory Visit: Payer: 59 | Admitting: Orthopaedic Surgery

## 2022-04-08 ENCOUNTER — Encounter: Payer: Self-pay | Admitting: Orthopaedic Surgery

## 2022-04-08 VITALS — Ht 66.0 in | Wt 205.4 lb

## 2022-04-08 DIAGNOSIS — M1711 Unilateral primary osteoarthritis, right knee: Secondary | ICD-10-CM | POA: Diagnosis not present

## 2022-04-08 DIAGNOSIS — M25562 Pain in left knee: Secondary | ICD-10-CM | POA: Diagnosis not present

## 2022-04-08 DIAGNOSIS — M1712 Unilateral primary osteoarthritis, left knee: Secondary | ICD-10-CM

## 2022-04-08 DIAGNOSIS — M25561 Pain in right knee: Secondary | ICD-10-CM | POA: Diagnosis not present

## 2022-04-08 DIAGNOSIS — G8929 Other chronic pain: Secondary | ICD-10-CM

## 2022-04-08 NOTE — Progress Notes (Signed)
The patient is a very pleasant 61 year old female who is sent to me for by Dr. Margaretha Sheffield to evaluate and treat severe end-stage arthritis of both her knees.  She says both are hurting you Ivar Drape.  This is going on for many years now and slowly getting worse.  She has had steroid injections in her knees.  She has not had hyaluronic acid but I did review her x-rays and there is really no indication for hyaluronic acid at this point given the severity of her tricompartment arthritis of both knees.  She does take Tylenol as needed for pain.  She works as a Conservation officer, nature at [employer].  She does have high blood pressure.  Her BMI is 33.  She is not a diabetic.  At this point her knee pain is detrimentally affecting her mobility, her quality of life and her actives daily living and she is interested in discussing knee replacement surgery.  She currently denies any headache, chest pain, shortness of breath, fever, chills, nausea, vomiting.  I was able to review her records in epic as well as her medications.  Both knees were examined and showed just a mild effusion of both knees.  There is significant patellofemoral crepitation and slight varus malalignment that is correctable.  She has significant medial joint line tenderness of both knees and pain throughout the arc of motion of both knees.  Her knees are ligamentously stable.  I went over the x-rays of both knees with her.  She has severe end-stage arthritis of both knees with complete loss of the joint space throughout the knee on both sides as well as sclerotic changes and para-articular osteophytes.  At this point I agree with the need for knee replacement surgery and this is totally up to her in terms of when to proceed with this.  I described in detail what the surgery involves showing her knee replacement model.  I discussed the risks and benefits of the surgery as well as describe what to expect from an intraoperative and postoperative course.  She would likely need to be  out of work for minimum 6 to 8 weeks as she recovers from surgery.  All questions and concerns were answered and addressed.  She said she would like to think about this so I gave her my card and our surgery scheduler's card and she is welcome to call with any other questions or call to have surgery scheduled.

## 2022-04-29 ENCOUNTER — Encounter: Payer: Self-pay | Admitting: Physician Assistant

## 2022-04-29 ENCOUNTER — Ambulatory Visit: Payer: 59 | Admitting: Physician Assistant

## 2022-04-29 VITALS — BP 163/94 | HR 77 | Ht 66.0 in | Wt 207.0 lb

## 2022-04-29 DIAGNOSIS — I10 Essential (primary) hypertension: Secondary | ICD-10-CM

## 2022-04-29 DIAGNOSIS — L309 Dermatitis, unspecified: Secondary | ICD-10-CM | POA: Diagnosis not present

## 2022-04-29 MED ORDER — NYSTATIN-TRIAMCINOLONE 100000-0.1 UNIT/GM-% EX OINT: 1.0000 | TOPICAL_OINTMENT | Freq: Two times a day (BID) | CUTANEOUS | 0 refills | Status: AC

## 2022-04-29 NOTE — Progress Notes (Unsigned)
   Established Patient Office Visit  Subjective   Patient ID: Angel Ramos, female    DOB: May 20, 1961  Age: 61 y.o. MRN: [id number]  No chief complaint on file.  207  Skin peeling on left elbow  two weeks ago - putting neosporin  No new  No one else with same   Bump without fluid before   Sept     Past Medical History:  Diagnosis Date   Arthritis    Cataract    Hypertension    Social History   Socioeconomic History   Marital status: Single    Spouse name: Not on file   Number of children: Not on file   Years of education: Not on file   Highest education level: Not on file  Occupational History   Not on file  Tobacco Use   Smoking status: Never   Smokeless tobacco: Never  Substance and Sexual Activity   Alcohol use: Yes    Alcohol/week: 0.0 standard drinks of alcohol    Comment: occasionally   Drug use: Not on file   Sexual activity: Not Currently  Other Topics Concern   Not on file  Social History Narrative   Not on file   Social Determinants of Health   Financial Resource Strain: Not on file  Food Insecurity: Not on file  Transportation Needs: Not on file  Physical Activity: Not on file  Stress: Not on file  Social Connections: Not on file  Intimate Partner Violence: Not on file   Family History  Problem Relation Age of Onset   Hypertension Mother    Diabetes Father    Heart disease Father    Cancer Maternal Grandmother        cervical cancer    Colon cancer Neg Hx    Esophageal cancer Neg Hx    Rectal cancer Neg Hx    Stomach cancer Neg Hx    No Known Allergies  ROS    Objective:     There were no vitals taken for this visit. {Vitals History (Optional):23777}  Physical Exam   No results found for any visits on 04/29/22.  {Labs (Optional):23779}  The ASCVD Risk score (Arnett DK, et al., 2019) failed to calculate for the following reasons:   Cannot find a previous HDL lab   Cannot find a previous total cholesterol lab     Assessment & Plan:   Problem List Items Addressed This Visit   None   No follow-ups on file.    Kasandra Knudsen Mayers, PA-C

## 2022-04-29 NOTE — Patient Instructions (Addendum)
You are going to use mycolog twice a day until area is resolved.  Please check your BP at home on a daily basis, keep a written log and have available for all office visits.  Please return to [facility] or f/u with your PCP if it is remaining elevated.  Roney Jaffe, PA-C Physician Assistant [facility] [email or web address]    Rash, Adult A rash is a change in the color of your skin. A rash can also change the way your skin feels. There are many different conditions and factors that can cause a rash. Some rashes may disappear after a few days, but some may last for a few weeks. Common causes of rashes include: Viral infections, such as: Colds. Measles. Hand, foot, and mouth disease. Bacterial infections, such as: Scarlet fever. Impetigo. Fungal infections, such as Candida. Allergic reactions to food, medicines, or skin care products. Follow these instructions at home: The goal of treatment is to stop the itching and keep the rash from spreading. Pay attention to any changes in your symptoms. Follow these instructions to help with your condition: Medicine Take or apply over-the-counter and prescription medicines only as told by your health care provider. These may include: Corticosteroid creams to treat red or swollen skin. Anti-itch lotions. Oral allergy medicines (antihistamines). Oral corticosteroids for severe symptoms.  Skin care Apply cool compresses to the affected areas. Do not scratch or rub your skin. Avoid covering the rash. Make sure the rash is exposed to air as much as possible. Managing itching and discomfort Avoid hot showers or baths, which can make itching worse. A cold shower may help. Try taking a bath with: Epsom salts. Follow manufacturer instructions on the packaging. You can get these at your local pharmacy or grocery store. Baking soda. Pour a small amount into the bath as told  by your health care provider. Colloidal oatmeal. Follow manufacturer instructions on the packaging. You can get this at your local pharmacy or grocery store. Try applying baking soda paste to your skin. Stir water into baking soda until it reaches a paste-like consistency. Try applying calamine lotion. This is an over-the-counter lotion that helps to relieve itchiness. Keep cool and out of the sun. Sweating and being hot can make itching worse. General instructions  Rest as needed. Drink enough fluid to keep your urine pale yellow. Wear loose-fitting clothing. Avoid scented soaps, detergents, and perfumes. Use gentle soaps, detergents, perfumes, and other cosmetic products. Avoid any substance that causes your rash. Keep a journal to help track what causes your rash. Write down: What you eat. What cosmetic products you use. What you drink. What you wear. This includes jewelry. Keep all follow-up visits as told by your health care provider. This is important. Contact a health care provider if: You sweat at night. You lose weight. You urinate more than normal. You urinate less than normal, or you notice that your urine is a darker color than usual. You feel weak. You vomit. Your skin or the whites of your eyes look yellow (jaundice). Your skin: Tingles. Is numb. Your rash: Does not go away after several days. Gets worse. You are: Unusually thirsty. More tired than normal. You have: New symptoms. Pain in your abdomen. A fever. Diarrhea. Get help right away if you: Have a fever and your symptoms suddenly get worse. Develop confusion. Have a severe headache or a stiff neck. Have severe joint pains or stiffness. Have a seizure. Develop a rash that covers all  or most of your body. The rash may or may not be painful. Develop blisters that: Are on top of the rash. Grow larger or grow together. Are painful. Are inside your nose or mouth. Develop a rash that: Looks like  purple pinprick-sized spots all over your body. Has a "bull's eye" or looks like a target. Is not related to sun exposure, is red and painful, and causes your skin to peel. Summary A rash is a change in the color of your skin. Some rashes disappear after a few days, but some may last for a few weeks. The goal of treatment is to stop the itching and keep the rash from spreading. Take or apply over-the-counter and prescription medicines only as told by your health care provider. Contact a health care provider if you have new or worsening symptoms. Keep all follow-up visits as told by your health care provider. This is important. This information is not intended to replace advice given to you by your health care provider. Make sure you discuss any questions you have with your health care provider. Document Revised: 07/31/2021 Document Reviewed: 07/31/2021 Elsevier Patient Education  [place].  How to Take Your Blood Pressure Blood pressure is a measurement of how strongly your blood is pressing against the walls of your arteries. Arteries are blood vessels that carry blood from your heart throughout your body. Your health care provider takes your blood pressure at each office visit. You can also take your own blood pressure at home with a blood pressure monitor. You may need to take your own blood pressure to: Confirm a diagnosis of high blood pressure (hypertension). Monitor your blood pressure over time. Make sure your blood pressure medicine is working. Supplies needed: Blood pressure monitor. A chair to sit in. This should be a chair where you can sit upright with your back supported. Do not sit on a soft couch or an armchair. Table or desk. Small notebook and pencil or pen. How to prepare To get the most accurate reading, avoid the following for 30 minutes before you check your blood pressure: Drinking caffeine. Drinking alcohol. Eating. Smoking. Exercising. Five minutes  before you check your blood pressure: Use the bathroom and urinate so that you have an empty bladder. Sit quietly in a chair. Do not talk. How to take your blood pressure To check your blood pressure, follow the instructions in the manual that came with your blood pressure monitor. If you have a digital blood pressure monitor, the instructions may be as follows: Sit up straight in a chair. Place your feet on the floor. Do not cross your ankles or legs. Rest your left arm at the level of your heart on a table or desk or on the arm of a chair. Pull up your shirt sleeve. Wrap the blood pressure cuff around the upper part of your left arm, 1 inch (2.5 cm) above your elbow. It is best to wrap the cuff around bare skin. Fit the cuff snugly, but not too tightly, around your arm. You should be able to place only one finger between the cuff and your arm. Position the cord so that it rests in the bend of your elbow. Press the power button. Sit quietly while the cuff inflates and deflates. Read the digital reading on the monitor screen and write the numbers down (record them) in a notebook. Wait 2-3 minutes, then repeat the steps, starting at step 1. What does my blood pressure reading mean? A blood pressure  reading consists of a higher number over a lower number. Ideally, your blood pressure should be below 120/80. The first ("top") number is called the systolic pressure. It is a measure of the pressure in your arteries as your heart beats. The second ("bottom") number is called the diastolic pressure. It is a measure of the pressure in your arteries as the heart relaxes. Blood pressure is classified into four stages. The following are the stages for adults who do not have a short-term serious illness or a chronic condition. Systolic pressure and diastolic pressure are measured in a unit called mm Hg (millimeters of mercury).  Normal Systolic pressure: below 120. Diastolic pressure: below  80. Elevated Systolic pressure: 120-129. Diastolic pressure: below 80. Hypertension stage 1 Systolic pressure: 130-139. Diastolic pressure: 80-89. Hypertension stage 2 Systolic pressure: 140 or above. Diastolic pressure: 90 or above. You can have elevated blood pressure or hypertension even if only the systolic or only the diastolic number in your reading is higher than normal. Follow these instructions at home: Medicines Take over-the-counter and prescription medicines only as told by your health care provider. Tell your health care provider if you are having any side effects from blood pressure medicine. General instructions Check your blood pressure as often as recommended by your health care provider. Check your blood pressure at the same time every day. Take your monitor to the next appointment with your health care provider to make sure that: You are using it correctly. It provides accurate readings. Understand what your goal blood pressure numbers are. Keep all follow-up visits. This is important. General tips Your health care provider can suggest a reliable monitor that will meet your needs. There are several types of home blood pressure monitors. Choose a monitor that has an arm cuff. Do not choose a monitor that measures your blood pressure from your wrist or finger. Choose a cuff that wraps snugly, not too tight or too loose, around your upper arm. You should be able to fit only one finger between your arm and the cuff. You can buy a blood pressure monitor at most drugstores or online. Where to find more information American Heart Association: www.heart.org Contact a health care provider if: Your blood pressure is consistently high. Your blood pressure is suddenly low. Get help right away if: Your systolic blood pressure is higher than 180. Your diastolic blood pressure is higher than 120. These symptoms may be an emergency. Get help right away. Call 911. Do not wait  to see if the symptoms will go away. Do not drive yourself to the hospital. Summary Blood pressure is a measurement of how strongly your blood is pressing against the walls of your arteries. A blood pressure reading consists of a higher number over a lower number. Ideally, your blood pressure should be below 120/80. Check your blood pressure at the same time every day. Avoid caffeine, alcohol, smoking, and exercise for 30 minutes prior to checking your blood pressure. These agents can affect the accuracy of the blood pressure reading. This information is not intended to replace advice given to you by your health care provider. Make sure you discuss any questions you have with your health care provider. Document Revised: 07/03/2021 Document Reviewed: 07/03/2021 Elsevier Patient Education  2023 [employer].

## 2022-07-31 ENCOUNTER — Other Ambulatory Visit: Payer: Self-pay | Admitting: Internal Medicine

## 2022-07-31 DIAGNOSIS — Z1231 Encounter for screening mammogram for malignant neoplasm of breast: Secondary | ICD-10-CM

## 2022-08-27 ENCOUNTER — Ambulatory Visit: Payer: Self-pay | Admitting: Sports Medicine

## 2022-09-10 ENCOUNTER — Ambulatory Visit: Payer: Self-pay | Admitting: Sports Medicine

## 2022-09-14 ENCOUNTER — Ambulatory Visit: Payer: 59

## 2022-09-17 ENCOUNTER — Ambulatory Visit (INDEPENDENT_AMBULATORY_CARE_PROVIDER_SITE_OTHER): Payer: Commercial Managed Care - HMO | Admitting: Sports Medicine

## 2022-09-17 VITALS — BP 139/82 | Ht 66.0 in | Wt 205.0 lb

## 2022-09-17 DIAGNOSIS — M170 Bilateral primary osteoarthritis of knee: Secondary | ICD-10-CM

## 2022-09-17 DIAGNOSIS — M25561 Pain in right knee: Secondary | ICD-10-CM

## 2022-09-17 DIAGNOSIS — G8929 Other chronic pain: Secondary | ICD-10-CM | POA: Diagnosis not present

## 2022-09-17 DIAGNOSIS — M25562 Pain in left knee: Secondary | ICD-10-CM | POA: Diagnosis not present

## 2022-09-17 MED ORDER — METHYLPREDNISOLONE ACETATE 40 MG/ML IJ SUSP
40.0000 mg | Freq: Once | INTRAMUSCULAR | Status: AC
  Administered 2022-09-17: 40 mg via INTRA_ARTICULAR

## 2022-09-17 NOTE — Progress Notes (Signed)
  SUBJECTIVE:   CHIEF COMPLAINT / HPI:   Patient presents today for cortisone injections into each knee.  Last injection on 03/31/2022.  She met with Dr. Ninfa Linden (orthopedics) and is considering knee replacements in the summertime.  She is also requesting updated handicap placard today.  PERTINENT  PMH / PSH: Osteoarthritis of both knees  OBJECTIVE:  BP 139/82   Ht _0  (1.676 m)   Wt 205 lb (93 kg)   BMI 33.09 kg/m  General: WDWN, NAD Bilateral knee: Flexion 120 degrees, no effusion or erythema appreciable  ASSESSMENT/PLAN:  Returning bilateral knee pain 2/2 end-stage DJD Bilateral injections performed today via anterolateral approach.  Provided 70-monthtemporary handicap placard.  Follow-up as needed.  After informed written consent timeout was performed, patient was seated on exam table. Right knee was prepped with alcohol swab x3 and topical analgesic spray and utilizing anteromedial approach, patient's right knee was injected intraarticularly with 3:1 lidocaine:depomedrol. Patient tolerated the procedure well without immediate complications.  After informed written consent timeout was performed, patient was seated on exam table. Left knee was prepped with alcohol swab x3 and topical analgesic spray and utilizing anteromedial approach, patient's right knee was injected intraarticularly with 3:1 lidocaine:depomedrol. Patient tolerated the procedure well without immediate complications.  AWells Guiles DO 09/17/2022, 9:31 AM PGY-2, [facility]  Patient seen and evaluated with the resident.  I agree with the above plan of care.  Repeat cortisone injections for bilateral knee OA performed as above.  She has met in consultation with Dr. BNinfa Lindento discuss total knee arthroplasty.  Her plan is to possibly proceed with that this summer.  She will follow[place]up with [place].

## 2022-11-03 ENCOUNTER — Ambulatory Visit
Admission: RE | Admit: 2022-11-03 | Discharge: 2022-11-03 | Disposition: A | Discharge status: Home / Self Care | Payer: Self-pay | Source: Ambulatory Visit | Attending: Internal Medicine | Admitting: Internal Medicine

## 2022-11-03 DIAGNOSIS — Z1231 Encounter for screening mammogram for malignant neoplasm of breast: Secondary | ICD-10-CM

## 2023-01-28 DIAGNOSIS — E669 Obesity, unspecified: Secondary | ICD-10-CM | POA: Diagnosis not present

## 2023-01-28 DIAGNOSIS — Z6836 Body mass index (BMI) 36.0-36.9, adult: Secondary | ICD-10-CM | POA: Diagnosis not present

## 2023-01-28 DIAGNOSIS — I10 Essential (primary) hypertension: Secondary | ICD-10-CM | POA: Diagnosis not present

## 2023-01-28 DIAGNOSIS — E785 Hyperlipidemia, unspecified: Secondary | ICD-10-CM | POA: Diagnosis not present

## 2023-03-16 DIAGNOSIS — M1990 Unspecified osteoarthritis, unspecified site: Secondary | ICD-10-CM | POA: Diagnosis not present

## 2023-03-16 DIAGNOSIS — Z8249 Family history of ischemic heart disease and other diseases of the circulatory system: Secondary | ICD-10-CM | POA: Diagnosis not present

## 2023-03-16 DIAGNOSIS — Z833 Family history of diabetes mellitus: Secondary | ICD-10-CM | POA: Diagnosis not present

## 2023-03-16 DIAGNOSIS — H02739 Vitiligo of unspecified eye, unspecified eyelid and periocular area: Secondary | ICD-10-CM | POA: Diagnosis not present

## 2023-03-16 DIAGNOSIS — R32 Unspecified urinary incontinence: Secondary | ICD-10-CM | POA: Diagnosis not present

## 2023-03-16 DIAGNOSIS — G3184 Mild cognitive impairment, so stated: Secondary | ICD-10-CM | POA: Diagnosis not present

## 2023-03-16 DIAGNOSIS — Z6835 Body mass index (BMI) 35.0-35.9, adult: Secondary | ICD-10-CM | POA: Diagnosis not present

## 2023-03-16 DIAGNOSIS — I10 Essential (primary) hypertension: Secondary | ICD-10-CM | POA: Diagnosis not present

## 2023-03-16 DIAGNOSIS — R600 Localized edema: Secondary | ICD-10-CM | POA: Diagnosis not present

## 2023-03-16 DIAGNOSIS — Z9181 History of falling: Secondary | ICD-10-CM | POA: Diagnosis not present

## 2023-04-08 DIAGNOSIS — H259 Unspecified age-related cataract: Secondary | ICD-10-CM | POA: Diagnosis not present

## 2023-04-08 DIAGNOSIS — Z961 Presence of intraocular lens: Secondary | ICD-10-CM | POA: Diagnosis not present

## 2023-04-09 DIAGNOSIS — Z713 Dietary counseling and surveillance: Secondary | ICD-10-CM | POA: Diagnosis not present

## 2023-04-09 DIAGNOSIS — K645 Perianal venous thrombosis: Secondary | ICD-10-CM | POA: Diagnosis not present

## 2023-04-09 DIAGNOSIS — Z6836 Body mass index (BMI) 36.0-36.9, adult: Secondary | ICD-10-CM | POA: Diagnosis not present

## 2023-04-09 DIAGNOSIS — I10 Essential (primary) hypertension: Secondary | ICD-10-CM | POA: Diagnosis not present

## 2023-04-09 DIAGNOSIS — E669 Obesity, unspecified: Secondary | ICD-10-CM | POA: Diagnosis not present

## 2023-07-23 ENCOUNTER — Other Ambulatory Visit: Payer: Self-pay

## 2023-07-23 ENCOUNTER — Encounter: Payer: Self-pay | Admitting: Family Medicine

## 2023-07-23 ENCOUNTER — Ambulatory Visit (INDEPENDENT_AMBULATORY_CARE_PROVIDER_SITE_OTHER): Payer: 59 | Admitting: Family Medicine

## 2023-07-23 VITALS — BP 148/98 | Ht 66.0 in | Wt 220.0 lb

## 2023-07-23 DIAGNOSIS — M25512 Pain in left shoulder: Secondary | ICD-10-CM | POA: Diagnosis not present

## 2023-07-23 DIAGNOSIS — M19012 Primary osteoarthritis, left shoulder: Secondary | ICD-10-CM

## 2023-07-23 MED ORDER — METHYLPREDNISOLONE ACETATE 40 MG/ML IJ SUSP
40.0000 mg | Freq: Once | INTRAMUSCULAR | Status: AC
  Administered 2023-07-23: 40 mg via INTRA_ARTICULAR

## 2023-07-24 DIAGNOSIS — M19019 Primary osteoarthritis, unspecified shoulder: Secondary | ICD-10-CM | POA: Insufficient documentation

## 2023-07-24 NOTE — Progress Notes (Signed)
ARRYN Ramos - 62 y.o. female MRN [id number]  Date of birth: [birth year]    SUBJECTIVE:      Chief Complaint:/ HPI:  Left shoulder pain x 2 to 3 weeks.  Came on kind of suddenly.  Very debilitating.  Right-hand-dominant.  Pain keeps her awake at night and she is unable to do anything above shoulder height on the left.  Never had this issue before.  No unusual activities recently.  No trauma    OBJECTIVE: BP (!) 148/98   Ht 5\' 6"  (1.676 m)   Wt 220 lb (99.8 kg)   BMI 35.51 kg/m   Physical Exam:  Vital signs are reviewed. GENERAL: Well-developed, no acute distress SHOULDERS: Symmetrical.  Left shoulder has full range of motion and normal strength in all planes the rotator cuff.  She has some pain with overhead extension but not with supraspinatus testing per se.  Internal rotation is limited a bit on both sides with her placing her hand behind her posterior hip.  She does have pain with this.  She also has pain with  cross arm test.   ULTRASOUNDS: Acromioclavicular joint: Left. Acromioclavicular joint seen in longitudinal and axial planes.  She has significant amount of fluid with, "the mushroom sign", on the superior portion.  Several osteophytes are noted.  PROCEDURE: INJECTION: Patient was given informed consent, signed copy in the chart. Appropriate time out was taken. Area prepped and draped in usual sterile fashion. Ethyl chloride was  used for local anesthesia. A 21 gauge 1 1/2 inch needle was used..  1 cc of methylprednisolone 40 mg/ml plus 1 cc of 1% lidocaine without epinephrine was injected into the left acromioclavicular joint using a(n) ultrasound-guided approach.   The needle could be visualized in the acromioclavicular joint space and also visualized fluid entering the area.  Patient tolerated procedure well.  There were no complications. Post procedure instructions were given.  ASSESSMENT & PLAN:  See problem based charting & AVS for pt  instructions. Acromioclavicular joint arthritis Ultrasound-guided injection with corticosteroid today.  Like to see her back in about 3 weeks.  Will have her do some range of motion exercises.  I suspect this is the majority of her shoulder pain but if she is not having significant improvement, would consider reevaluation of rotator cuff.  I think the pain from the acromioclavicular joint may be masking other more minor issues with the rotator cuff such as impingement.

## 2023-07-24 NOTE — Assessment & Plan Note (Signed)
Ultrasound-guided injection with corticosteroid today.  Like to see her back in about 3 weeks.  Will have her do some range of motion exercises.  I suspect this is the majority of her shoulder pain but if she is not having significant improvement, would consider reevaluation of rotator cuff.  I think the pain from the acromioclavicular joint may be masking other more minor issues with the rotator cuff such as impingement.

## 2023-10-04 ENCOUNTER — Ambulatory Visit: Payer: 59 | Admitting: Family Medicine

## 2023-10-25 ENCOUNTER — Other Ambulatory Visit: Payer: Self-pay | Admitting: Internal Medicine

## 2023-10-25 DIAGNOSIS — Z0000 Encounter for general adult medical examination without abnormal findings: Secondary | ICD-10-CM

## 2023-11-10 ENCOUNTER — Ambulatory Visit: Payer: 59

## 2023-11-23 ENCOUNTER — Encounter: Payer: Self-pay | Admitting: Family Medicine

## 2023-11-23 ENCOUNTER — Other Ambulatory Visit: Payer: Self-pay

## 2023-11-23 ENCOUNTER — Ambulatory Visit (INDEPENDENT_AMBULATORY_CARE_PROVIDER_SITE_OTHER): Payer: 59 | Admitting: Family Medicine

## 2023-11-23 VITALS — BP 138/74 | Ht 66.0 in | Wt 225.0 lb

## 2023-11-23 DIAGNOSIS — M1711 Unilateral primary osteoarthritis, right knee: Secondary | ICD-10-CM

## 2023-11-23 DIAGNOSIS — M19012 Primary osteoarthritis, left shoulder: Secondary | ICD-10-CM | POA: Diagnosis not present

## 2023-11-23 DIAGNOSIS — M7502 Adhesive capsulitis of left shoulder: Secondary | ICD-10-CM | POA: Insufficient documentation

## 2023-11-23 DIAGNOSIS — M25512 Pain in left shoulder: Secondary | ICD-10-CM | POA: Diagnosis not present

## 2023-11-23 DIAGNOSIS — M1712 Unilateral primary osteoarthritis, left knee: Secondary | ICD-10-CM

## 2023-11-23 MED ORDER — METHYLPREDNISOLONE ACETATE 40 MG/ML IJ SUSP
40.0000 mg | Freq: Once | INTRAMUSCULAR | Status: AC
  Administered 2023-11-23: 40 mg via INTRA_ARTICULAR

## 2023-11-23 NOTE — Progress Notes (Signed)
ANNEELIZABETH SPROW - 63 y.o. female MRN [id number]  Date of birth: Nov 15, 1960  PCP: Melida Quitter, MD  Subjective:  No chief complaint on file. Left shoulder and bilateral knee pain  HPI: Past Medical, Surgical, Social, and Family History Reviewed & Updated per EMR.   Patient is a 63 y.o. female here for follow up on left shoulder and bilateral knee pain, last seen on 07/23/2023.  The [facility] joint injection she received previously gave her some mild relief that was temporary.  Since then she has had ongoing pain that limits her range of motion, interferes with lying on her left side, and has progressively decreased her range of motion.  She has tried heat which has helped some, but mostly avoid activities that hurt.  Her knees ache some bilaterally and she got marked relief from the last steroid injection but she does not believe that her pain is enough to be injections today.  She does still walk about a mile from the parking lot to her work at [employer].  This causes her substantial difficulty with both getting, work and dealing with the symptoms afterwards.  Past Medical History:  Diagnosis Date   Arthritis    Cataract    Hypertension     Current Outpatient Medications on File Prior to Visit  Medication Sig Dispense Refill   atenolol (TENORMIN) 25 MG tablet Take by mouth daily. (Patient not taking: Reported on 10/15/2021)     nystatin-triamcinolone ointment (MYCOLOG) Apply 1 Application topically 2 (two) times daily. 30 g 0   Current Facility-Administered Medications on File Prior to Visit  Medication Dose Route Frequency Provider Last Rate Last Admin   0.9 %  sodium chloride infusion  500 mL Intravenous Continuous Danis, Starr Lake III, MD        Past Surgical History:  Procedure Laterality Date   ABDOMINAL HYSTERECTOMY      No Known Allergies      Objective:  Physical Exam: VS: BP:138/74  HR: bpm  TEMP: ( )  RESP:   HT:5\' 6"  (167.6 cm)   WT:225 lb (102.1 kg)  BMI:36.33  Gen: NAD,  speaks clearly, comfortable in exam room Respiratory: Normal respiratory effort on room air. No signs of distress Skin: No rashes, abrasions, or ecchymosis MSK:   Left shoulder: Inspection no abnormalities ROM: Active flexion to 160 degrees, abduction to 50 degrees, internal rotation limited to midline, and external rotation limited to 15 degrees.  Strength: 5/5 in all directions AC joint: Tender to palpation Special tests: Empty can, Neer's, Hawkins positive O'Brien's negative  Bilateral knee: Inspection: No abnormalities ROM: Full Medial and lateral joint line tenderness to palpation bilaterally Gait mildly antalgic  Ultrasound of left shoulder:  Biceps Tendon SAX and LAX: visualized in bicipital groove w/out hypoechoic changes, pectoralis and subscapularis insertions in tact, IR/ER demonstrates some lateral subluxing of the tendon out of the groove Subscapularis tendon - viewed in SAX and LAX inserting into the inferior lesser tubercle of humerus. echogenics: hyperechoic changes within the tendon fibers suggesting previous trauma/injury and as well as some hypoechoic fluid surrounding the insertion point AC joint - narrowing, w/ osteophyte formation, no giser sign Supraspinatus tendon - viewed in SAX and LAX, tendon in tact, insertion at superior facet of greater tubercle of the humerus w/ echogenics: hypoechoic changes within the tendon confirmed in both views and partial tearing evident Infraspinatus and Teres minor tendons - viewed in LAX and SAX at the middle facet of the greater tubercle of humerus w/  echogenics: hypoechoic changes within the tendon confirmed in both views Posterior glenohumeral joint visualized small effusion present with overlying hyperechoic changes to the capsule and overlying soft tissue  Summary: Rotator Cuff Tendonopathy with partial tearing of the supraspinatus, infraspinatus, subscapularis with fibrous changes consistent with scarring.  Glenohumeral joint  indicative of adhesive capsulitis.  AC joint arthritis present.  Ultrasound and interpretation by Dr. Webb Silversmith and Dr. Christella Hartigan   Assessment & Plan:   Adhesive capsulitis of left shoulder - On exam, the patient has restriction with external rotation, internal and abduction most notably. -Ultrasound showed partial rotator cuff tearing with scarring and calcification as well as a small effusion in the glenohumeral joint and hyperechoic changes around the capsule indicative of adhesive capsulitis. - Glenohumeral joint injection today.  The patient tolerated well.  Hopefully this will give her some relief. - She will start home exercises and physical therapy and will follow-up in 6 weeks to reevaluate progress. - We also discussed the use of ice and topical Voltaren gel - I counseled the patient on the expected course of recovery.  The patient voiced understanding and agreement with the plan.   Unilateral primary osteoarthritis, left knee - The patient followed up with Ortho and is contemplating when she wants to get her knee replacements done. - She still having pain with ambulation but is not bad enough to the injection today. - For work she has to walk a substantial distance in the parking lot.  Due to this and the likelihood of knee replacement in future we will renew her temporary disability tag. - She can follow-up as needed for knee injections.   Unilateral primary osteoarthritis, right knee - See plan for left knee osteoarthritis     Rica Mote MD [facility] Sports Medicine Fellow

## 2023-11-23 NOTE — Assessment & Plan Note (Signed)
-   The patient followed up with Ortho and is contemplating when she wants to get her knee replacements done. - She still having pain with ambulation but is not bad enough to the injection today. - For work she has to walk a substantial distance in the parking lot.  Due to this and the likelihood of knee replacement in future we will renew her temporary disability tag. - She can follow-up as needed for knee injections.

## 2023-11-23 NOTE — Assessment & Plan Note (Addendum)
-   On exam, the patient has restriction with external rotation, internal and abduction most notably. -Ultrasound showed partial rotator cuff tearing with scarring and calcification as well as a small effusion in the glenohumeral joint and hyperechoic changes around the capsule indicative of adhesive capsulitis. - Glenohumeral joint injection today.  The patient tolerated well.  Hopefully this will give her some relief. - She will start home exercises and physical therapy and will follow-up in 6 weeks to reevaluate progress. - We also discussed the use of ice and topical Voltaren gel - I counseled the patient on the expected course of recovery.  The patient voiced understanding and agreement with the plan.

## 2023-11-23 NOTE — Patient Instructions (Signed)
Today you received an injection with a corticosteroid. This injection is usually done in response to pain and inflammation. There is some "numbing medicine" (Lidocaine) in the shot, so the injected area may be numb and feel really good for the next couple of hours. The numbing medicine usually wears off in 2-3 hours, and then your pain level may be back to where it was before the injection until the steroid starts working.    You may actually experience a small (as in 10%) INCREASE in pressure sensation in the first 24 hours---that is common.  Things to watch out for that you should contact us or a health care provider urgently would include: 1. Unusual (as in more than 10%) increase in pain 2. New fever > 101.5 3. New swelling or redness of the injected area. 4. Streaking of red lines around the area injected.  Do not hesitate to call or reach out with any questions or concerns.  Do the wall crawl exercises multiple times a day You can also ice the shoulder for some relief You can try Voltaren gel topically If you have increasing pain or unable to perform the exercises please contact us and let us know.

## 2023-11-23 NOTE — Assessment & Plan Note (Signed)
-   See plan for left knee osteoarthritis

## 2023-11-26 ENCOUNTER — Ambulatory Visit
Admission: RE | Admit: 2023-11-26 | Discharge: 2023-11-26 | Disposition: A | Discharge status: Home / Self Care | Payer: 59 | Source: Ambulatory Visit | Attending: Internal Medicine | Admitting: Internal Medicine

## 2023-11-26 DIAGNOSIS — Z0000 Encounter for general adult medical examination without abnormal findings: Secondary | ICD-10-CM

## 2023-11-26 DIAGNOSIS — Z1231 Encounter for screening mammogram for malignant neoplasm of breast: Secondary | ICD-10-CM | POA: Diagnosis not present

## 2023-12-10 ENCOUNTER — Ambulatory Visit: Payer: 59 | Admitting: Physical Therapy

## 2024-01-14 ENCOUNTER — Other Ambulatory Visit (HOSPITAL_COMMUNITY): Payer: Self-pay

## 2024-01-14 MED ORDER — LAGEVRIO 200 MG PO CAPS
4.0000 | ORAL_CAPSULE | Freq: Two times a day (BID) | ORAL | 0 refills | Status: AC
  Filled 2024-01-14: qty 40, 5d supply, fill #0

## 2024-06-15 ENCOUNTER — Ambulatory Visit (INDEPENDENT_AMBULATORY_CARE_PROVIDER_SITE_OTHER): Payer: Self-pay

## 2024-06-15 VITALS — BP 136/75 | Ht 66.0 in | Wt 220.0 lb

## 2024-06-15 DIAGNOSIS — M170 Bilateral primary osteoarthritis of knee: Secondary | ICD-10-CM | POA: Insufficient documentation

## 2024-06-15 MED ORDER — METHYLPREDNISOLONE ACETATE 40 MG/ML IJ SUSP
40.0000 mg | Freq: Once | INTRAMUSCULAR | Status: AC
  Administered 2024-06-15: 40 mg via INTRA_ARTICULAR

## 2024-06-15 NOTE — Progress Notes (Signed)
 PCP: Stephane Leita DEL, MD  Subjective:   HPI: Patient is a 63 y.o. female here today to follow-up regarding bilateral knee osteoarthritis.  Requesting bilateral knee injections.  Patient has osteoarthritis of both knees and last had bilateral cortisone injections in November 2023 with Dr. Arvell.  Says this helped tremendously.  Has not required them again since then.  Patient says her knees started hurting again roughly 1 month ago.  Prolonged standing or ambulation make knee pain worse.  However biking seems to help them somewhat.  Occasionally takes Tylenol for pain but not helping significantly.  Denies any acute injuries to her knees over the last couple years. Past Medical History:  Diagnosis Date   Arthritis    Cataract    Hypertension     Current Outpatient Medications on File Prior to Visit  Medication Sig Dispense Refill   atenolol (TENORMIN) 25 MG tablet Take by mouth daily. (Patient not taking: Reported on 10/15/2021)     molnupiravir  EUA (LAGEVRIO ) 200 MG CAPS capsule Take 4 capsules (800 mg total) by mouth every 12 (twelve) hours for 5 days. 40 capsule 0   nystatin -triamcinolone  ointment (MYCOLOG) Apply 1 Application topically 2 (two) times daily. 30 g 0   Current Facility-Administered Medications on File Prior to Visit  Medication Dose Route Frequency Provider Last Rate Last Admin   0.9 %  sodium chloride  infusion  500 mL Intravenous Continuous Danis, Victory LITTIE MOULD, MD        Past Surgical History:  Procedure Laterality Date   ABDOMINAL HYSTERECTOMY      No Known Allergies  BP 136/75   Ht 5' 6 (1.676 m)   Wt 220 lb (99.8 kg)   BMI 35.51 kg/m       No data to display              No data to display              Objective:  Physical Exam:  Gen: NAD, comfortable in exam room  Inspection of bilateral knees reveals no erythema, ecchymosis, and mild intra-articular edema (estimated 5-10 cc).  Tender to palpation along bilateral joint lines.  Has full knee  range of motion but does have some palpable crepitus present.  Neurovascularly intact.  Lachman negative.  Varus/valgus stress taken and negative.  McMurray's negative.  Exam consistent with osteoarthritis.   Assessment & Plan:  1.  Bilateral knee osteoarthritis - Completed cortisone injection today to bilateral knees today given patient's dramatic improvement in symptoms with pain relief lasting greater than 1 year.  Last injection given in November 2023.  Patient tolerated procedure well.  See procedure note below. -Discussed additional conservative treatment options for knee osteoarthritis including:  Exercise (aerobic, strengthening, neuromuscular, aquatic, balance, or mind-body such as tai chi): Strongly recommended as first-line therapy to improve pain, function, and quality of life.   Weight loss: Strongly recommended for patients who are overweight or obese, with clinically meaningful benefits seen with >=5% weight reduction, and greater benefits with higher weight loss.  Tibiofemoral bracing: Recommended for tibiofemoral knee OA to reduce pain and improve walking speed.  Will defer treatment option today.  Topical NSAIDs: Strongly recommended as first-line pharmacologic therapy for knee OA due to efficacy and favorable safety profile.  Recommended Voltaren gel.  PROCEDURE:  Risks & benefits of injection reviewed. Consent obtained. Time-out completed.  Bilateral Knee Injections.  Patient prepped and draped in the normal fashion. Area cleansed with alcohol. Ethyl chloride spray used to anesthetize  the skin. Solution of 4 mL 1% lidocaine with 1 mL methylprednisolone  (Depo-medrol ) 40mg /mL injected into the right knee using a 22-gauge 1.5-inch needle via the anterior lateral approach. Patient tolerated procedure well without any complications. Area covered with adhesive bandage.  Left knee completed with same procedure. Patient prepped and draped in the normal fashion. Area cleansed with alcohol.  Ethyl chloride spray used to anesthetize the skin. Solution of 4 mL 1% lidocaine with 1 mL methylprednisolone  (Depo-medrol ) 40mg /mL injected into the left knee using a 22-gauge 1.5-inch needle via the anterior latearl approach. Patient tolerated procedure well without any complications. Area covered with adhesive bandage. Post-procedure care reviewed, all questions answered.

## 2024-06-15 NOTE — Patient Instructions (Signed)

## 2024-07-14 ENCOUNTER — Ambulatory Visit (INDEPENDENT_AMBULATORY_CARE_PROVIDER_SITE_OTHER): Payer: Self-pay | Admitting: Family Medicine

## 2024-07-14 ENCOUNTER — Other Ambulatory Visit: Payer: Self-pay

## 2024-07-14 VITALS — BP 156/84 | Ht 66.0 in | Wt 220.0 lb

## 2024-07-14 DIAGNOSIS — M1711 Unilateral primary osteoarthritis, right knee: Secondary | ICD-10-CM

## 2024-07-14 DIAGNOSIS — M1712 Unilateral primary osteoarthritis, left knee: Secondary | ICD-10-CM

## 2024-07-14 MED ORDER — TRIAMCINOLONE ACETONIDE 40 MG/ML IJ SUSP
80.0000 mg | Freq: Once | INTRAMUSCULAR | Status: AC
  Administered 2024-07-14: 80 mg via INTRA_ARTICULAR

## 2024-07-14 NOTE — Progress Notes (Signed)
  Angel Ramos - 63 y.o. female MRN [id number]  Date of birth: 1961/02/12    SUBJECTIVE:      Chief Complaint:/ HPI:  Continued right knee pain.  Had bilateral corticosteroid injections at last office visit with significant improvement bilaterally but the right side started bothering her again.  She has got quite a bit of stiffness in that knee as well.  Has questions about whether or not riding a bike would be helpful.  Will be getting Medicare in the spring and would like to pursue knee joint replacements bilaterally.  Has not yet seen an orthopedist.    OBJECTIVE: BP (!) 156/84   Ht 5' 6 (1.676 m)   Wt 220 lb (99.8 kg)   BMI 35.51 kg/m   Physical Exam:  Vital signs are reviewed. 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  She does not have full extension on the right lacking 10 to 15 degrees.  Full extension on the left.  Medial and lateral joint line tenderness on the right. ULTRASOUND: Small amount of fluid in the suprapatellar pouch.  The quadricep tendon and patellar tendon are intact without any sign of defect.  She has osteophytes bilaterally at the medial and lateral joint lines.  PROCEDURE: INJECTION: Patient was given informed consent, signed copy in the chart. Appropriate time out was taken. Area prepped and draped in usual sterile fashion. Ethyl chloride was  used for local anesthesia. A 21 gauge 1 1/2 inch needle was used..  1 cc of methylprednisolone  80 mg/ml plus  4 cc of 1% lidocaine without epinephrine was injected into the right knee using a(n) ultrasound-guided superior lateral approach.,  Dr. Bernardo performed procedure under my supervision for the entire procedure.  The patient tolerated the procedure well. There were no complications. Post procedure instructions were given.   ASSESSMENT & PLAN: Bilateral, right greater than left DJD of knee.  Will repeat corticosteroid injection in  the right 1 today.  Will go ahead and get her set up to see orthopedist in preparation for possible joint replacements after she receives Medicare this spring.  She will follow-up 1 month. See problem based charting & AVS for pt instructions. No problem-specific Assessment & Plan notes found for this encounter.

## 2024-08-30 ENCOUNTER — Ambulatory Visit: Payer: Self-pay | Admitting: Orthopaedic Surgery

## 2024-09-04 ENCOUNTER — Encounter: Payer: Self-pay | Admitting: Radiology

## 2024-11-07 DIAGNOSIS — Z1231 Encounter for screening mammogram for malignant neoplasm of breast: Secondary | ICD-10-CM

## 2024-12-01 ENCOUNTER — Ambulatory Visit: Payer: Self-pay

## 2024-12-15 ENCOUNTER — Ambulatory Visit
